# Patient Record
Sex: Female | Born: 1980 | Race: Black or African American | Hispanic: No | Marital: Single | Smoking: Never smoker
Health system: Southern US, Community
[De-identification: ages and names within clinical notes are randomized; demographics above are authoritative.]

## PROBLEM LIST (undated history)

## (undated) DIAGNOSIS — D649 Anemia, unspecified: Secondary | ICD-10-CM

## (undated) DIAGNOSIS — Z8669 Personal history of other diseases of the nervous system and sense organs: Secondary | ICD-10-CM

## (undated) DIAGNOSIS — K589 Irritable bowel syndrome without diarrhea: Secondary | ICD-10-CM

## (undated) HISTORY — PX: WISDOM TOOTH EXTRACTION: SHX21

## (undated) HISTORY — DX: Personal history of other diseases of the nervous system and sense organs: Z86.69

## (undated) HISTORY — PX: FOOT SURGERY: SHX648

## (undated) HISTORY — DX: Irritable bowel syndrome, unspecified: K58.9

## (undated) HISTORY — DX: Anemia, unspecified: D64.9

---

## 1999-06-16 ENCOUNTER — Emergency Department (HOSPITAL_COMMUNITY): Admission: EM | Admit: 1999-06-16 | Discharge: 1999-06-16 | Payer: Self-pay | Admitting: Emergency Medicine

## 1999-11-20 ENCOUNTER — Other Ambulatory Visit: Admission: RE | Admit: 1999-11-20 | Discharge: 1999-11-20 | Payer: Self-pay | Admitting: Internal Medicine

## 1999-12-13 ENCOUNTER — Inpatient Hospital Stay (HOSPITAL_COMMUNITY): Admission: AD | Admit: 1999-12-13 | Discharge: 1999-12-13 | Payer: Self-pay | Admitting: Internal Medicine

## 1999-12-17 ENCOUNTER — Inpatient Hospital Stay (HOSPITAL_COMMUNITY): Admission: AD | Admit: 1999-12-17 | Discharge: 1999-12-17 | Payer: Self-pay | Admitting: Obstetrics

## 2000-05-03 ENCOUNTER — Inpatient Hospital Stay (HOSPITAL_COMMUNITY): Admission: AD | Admit: 2000-05-03 | Discharge: 2000-05-03 | Payer: Self-pay | Admitting: Internal Medicine

## 2000-06-20 ENCOUNTER — Emergency Department (HOSPITAL_COMMUNITY): Admission: EM | Admit: 2000-06-20 | Discharge: 2000-06-20 | Payer: Self-pay | Admitting: Emergency Medicine

## 2000-11-05 ENCOUNTER — Other Ambulatory Visit: Admission: RE | Admit: 2000-11-05 | Discharge: 2000-11-05 | Payer: Self-pay | Admitting: Internal Medicine

## 2001-11-20 ENCOUNTER — Other Ambulatory Visit: Admission: RE | Admit: 2001-11-20 | Discharge: 2001-11-20 | Payer: Self-pay | Admitting: Internal Medicine

## 2003-07-26 ENCOUNTER — Other Ambulatory Visit: Admission: RE | Admit: 2003-07-26 | Discharge: 2003-07-26 | Payer: Self-pay | Admitting: Obstetrics and Gynecology

## 2004-04-07 ENCOUNTER — Ambulatory Visit (HOSPITAL_COMMUNITY): Admission: RE | Admit: 2004-04-07 | Discharge: 2004-04-07 | Payer: Self-pay | Admitting: Family Medicine

## 2004-04-07 ENCOUNTER — Emergency Department (HOSPITAL_COMMUNITY): Admission: EM | Admit: 2004-04-07 | Discharge: 2004-04-07 | Payer: Self-pay | Admitting: Emergency Medicine

## 2004-04-08 ENCOUNTER — Ambulatory Visit (HOSPITAL_COMMUNITY): Admission: RE | Admit: 2004-04-08 | Discharge: 2004-04-08 | Payer: Self-pay | Admitting: Family Medicine

## 2004-05-01 ENCOUNTER — Encounter: Admission: RE | Admit: 2004-05-01 | Discharge: 2004-05-01 | Payer: Self-pay | Admitting: Gastroenterology

## 2004-08-18 ENCOUNTER — Other Ambulatory Visit: Admission: RE | Admit: 2004-08-18 | Discharge: 2004-08-18 | Payer: Self-pay | Admitting: Obstetrics and Gynecology

## 2004-09-08 ENCOUNTER — Encounter: Admission: RE | Admit: 2004-09-08 | Discharge: 2004-09-08 | Payer: Self-pay | Admitting: Gastroenterology

## 2005-12-21 ENCOUNTER — Ambulatory Visit: Payer: Self-pay | Admitting: Gynecology

## 2006-04-24 ENCOUNTER — Ambulatory Visit: Payer: Self-pay | Admitting: Obstetrics & Gynecology

## 2009-04-21 ENCOUNTER — Emergency Department (HOSPITAL_COMMUNITY): Admission: EM | Admit: 2009-04-21 | Discharge: 2009-04-21 | Payer: Self-pay | Admitting: Emergency Medicine

## 2010-11-17 NOTE — Group Therapy Note (Signed)
NAME:  Kelly Walsh, Kelly Walsh NO.:  [id number]   MEDICAL RECORD NO.:  [id number]          PATIENT TYPE:  WOC   LOCATION:  [facility]                   FACILITY:  [facility]   PHYSICIAN:  Dorthula Perfect, MD     DATE OF BIRTH:  Dec 12, 1980   DATE OF SERVICE:                                    CLINIC NOTE   A 30 year old black female, nulligravida, presents today with an ongoing  problem with vaginal discharge.  Back in June of this year, she was treated  with Metrogel for a possible bacterial vaginitis.  On and off for the past  few months she has had a vaginal discharge.   She has not had sexual intercourse since January of this year.  She does not  douche.  She does not use scented toilet paper, and does pull the toilet  tissue in the correct front-to-back manner.  She uses Dove and other  moisturizing soaps when she bathes.   For birth control she has the Implanon.  Her last regular period was  September 5 and she has had occasional spotting.   PHYSICAL EXAM:  ABDOMEN:  Soft and nontender.  PELVIC:  External genitalia and BUS glands are normal.  Vaginal vault is  epithelialized.  There is a watery white discharge.  It is not odorous.  Cervix is normal.  Uterus is of normal size and shape.  Adnexal structures  are normal.   Wet prep is done by myself and reveals numerous bacteria and what appear to  be a few clue cells.  She had no yeast.   IMPRESSION:  Probable bacterial vaginosis.   DISPOSITION:  1. She is given a prescription for metronidazole 500 mg to take 1 two      times a day for a week.  2. She is instructed to keep all chemicals, moisturizers, lotions,      etcetera, away from her perineal area.  I have recommended Ivory soap      in the short term.           ______________________________  Dorthula Perfect, MD     ER/MEDQ  D:  04/24/2006  T:  04/25/2006  Job:  [id number]

## 2012-06-03 ENCOUNTER — Encounter: Payer: Self-pay | Admitting: Obstetrics and Gynecology

## 2012-06-03 ENCOUNTER — Ambulatory Visit (INDEPENDENT_AMBULATORY_CARE_PROVIDER_SITE_OTHER): Payer: BC Managed Care – PPO | Admitting: Obstetrics and Gynecology

## 2012-06-03 VITALS — BP 112/66 | Wt 170.0 lb

## 2012-06-03 DIAGNOSIS — Z30017 Encounter for initial prescription of implantable subdermal contraceptive: Secondary | ICD-10-CM

## 2012-06-03 DIAGNOSIS — Z3046 Encounter for surveillance of implantable subdermal contraceptive: Secondary | ICD-10-CM

## 2012-06-03 LAB — POCT URINE PREGNANCY: Preg Test, Ur: NEGATIVE

## 2012-06-03 MED ORDER — ETONOGESTREL 68 MG ~~LOC~~ IMPL
68.0000 mg | DRUG_IMPLANT | Freq: Once | SUBCUTANEOUS | Status: AC
  Administered 2012-06-03: 68 mg via SUBCUTANEOUS

## 2012-06-03 NOTE — Patient Instructions (Addendum)
Call [facility] [phone]:  -for temperature of 100.4 degrees Fahrenheit or more -pain not improved with over the counter pain medications (Ibuprofen, Advil, Aleve,     Tylenol or acetaminophen) -for excessive bleeding from insertion site -for excessive swelling redness or green drainage from your insertion site -for any other concerns -keep insertion site clean, dry and covered  for 48 hours -you may remove pressure bandage in 1-4 hours  Use a back-up method of birth control for the next 4 weeks

## 2012-06-03 NOTE — Progress Notes (Signed)
Discuss bc, past due for implanon removal. She desires to have implanon removal and insertion. We will check her insurance and  If cleared will be done by Henreitta Leber, PA-C  31 YO for Implanon removal and Nexplanon  reinsertion.  O: Implanon removed per protocol from medial left upper arm and Nexplanon placed per protocol in the same location, patient tolerated both procedures well and rod palpated by clinician,  incision closed with steri-strips and Benzoin, dressed with sterile band-aids, 4 x 4 gauze and Kling pressure dressing   UPT-negative  A: Implanon Removal      Nexplanon Insertion  P:Reviewed signs and symptoms of infection and wound care      RTO- 1 week or prn  POWELL,ELMIRA, PA-C

## 2012-06-11 ENCOUNTER — Encounter: Payer: BC Managed Care – PPO | Admitting: Obstetrics and Gynecology

## 2012-09-15 ENCOUNTER — Emergency Department (HOSPITAL_BASED_OUTPATIENT_CLINIC_OR_DEPARTMENT_OTHER): Payer: BC Managed Care – PPO

## 2012-09-15 ENCOUNTER — Emergency Department (HOSPITAL_BASED_OUTPATIENT_CLINIC_OR_DEPARTMENT_OTHER)
Admission: EM | Admit: 2012-09-15 | Discharge: 2012-09-15 | Disposition: A | Discharge status: Skilled Nursing Facility | Payer: BC Managed Care – PPO | Attending: Emergency Medicine | Admitting: Emergency Medicine

## 2012-09-15 ENCOUNTER — Encounter (HOSPITAL_BASED_OUTPATIENT_CLINIC_OR_DEPARTMENT_OTHER): Payer: Self-pay | Admitting: Family Medicine

## 2012-09-15 DIAGNOSIS — IMO0002 Reserved for concepts with insufficient information to code with codable children: Secondary | ICD-10-CM | POA: Insufficient documentation

## 2012-09-15 DIAGNOSIS — Z862 Personal history of diseases of the blood and blood-forming organs and certain disorders involving the immune mechanism: Secondary | ICD-10-CM | POA: Insufficient documentation

## 2012-09-15 DIAGNOSIS — Y9389 Activity, other specified: Secondary | ICD-10-CM | POA: Insufficient documentation

## 2012-09-15 DIAGNOSIS — R5381 Other malaise: Secondary | ICD-10-CM | POA: Insufficient documentation

## 2012-09-15 DIAGNOSIS — Z8679 Personal history of other diseases of the circulatory system: Secondary | ICD-10-CM | POA: Insufficient documentation

## 2012-09-15 DIAGNOSIS — R42 Dizziness and giddiness: Secondary | ICD-10-CM | POA: Insufficient documentation

## 2012-09-15 DIAGNOSIS — Y92410 Unspecified street and highway as the place of occurrence of the external cause: Secondary | ICD-10-CM | POA: Insufficient documentation

## 2012-09-15 DIAGNOSIS — S0083XA Contusion of other part of head, initial encounter: Secondary | ICD-10-CM | POA: Insufficient documentation

## 2012-09-15 DIAGNOSIS — S0993XA Unspecified injury of face, initial encounter: Secondary | ICD-10-CM | POA: Insufficient documentation

## 2012-09-15 DIAGNOSIS — S199XXA Unspecified injury of neck, initial encounter: Secondary | ICD-10-CM | POA: Insufficient documentation

## 2012-09-15 DIAGNOSIS — Z8719 Personal history of other diseases of the digestive system: Secondary | ICD-10-CM | POA: Insufficient documentation

## 2012-09-15 DIAGNOSIS — S0003XA Contusion of scalp, initial encounter: Secondary | ICD-10-CM | POA: Insufficient documentation

## 2012-09-15 DIAGNOSIS — Z79899 Other long term (current) drug therapy: Secondary | ICD-10-CM | POA: Insufficient documentation

## 2012-09-15 MED ORDER — TRAMADOL HCL 50 MG PO TABS
50.0000 mg | ORAL_TABLET | Freq: Once | ORAL | Status: AC
  Administered 2012-09-15: 50 mg via ORAL
  Filled 2012-09-15: qty 1

## 2012-09-15 MED ORDER — TRAMADOL HCL 50 MG PO TABS: 50.0000 mg | ORAL_TABLET | Freq: Four times a day (QID) | ORAL | Status: DC | PRN

## 2012-09-15 MED ORDER — ONDANSETRON 4 MG PO TBDP
4.0000 mg | ORAL_TABLET | Freq: Once | ORAL | Status: AC
  Administered 2012-09-15: 4 mg via ORAL
  Filled 2012-09-15: qty 1

## 2012-09-15 MED ORDER — IBUPROFEN 800 MG PO TABS
800.0000 mg | ORAL_TABLET | Freq: Once | ORAL | Status: AC
  Administered 2012-09-15: 800 mg via ORAL
  Filled 2012-09-15: qty 1

## 2012-09-15 MED ORDER — ONDANSETRON HCL 4 MG PO TABS: 4.0000 mg | ORAL_TABLET | Freq: Four times a day (QID) | ORAL | Status: DC

## 2012-09-15 MED ORDER — HYDROCODONE-ACETAMINOPHEN 5-325 MG PO TABS
1.0000 | ORAL_TABLET | Freq: Once | ORAL | Status: DC
  Filled 2012-09-15: qty 1
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
## 2014-03-12 ENCOUNTER — Encounter: Payer: Self-pay | Admitting: Podiatrist

## 2014-03-12 ENCOUNTER — Encounter: Payer: Self-pay | Admitting: *Deleted

## 2014-03-12 ENCOUNTER — Ambulatory Visit (INDEPENDENT_AMBULATORY_CARE_PROVIDER_SITE_OTHER): Payer: BC Managed Care – PPO | Admitting: Podiatrist

## 2014-03-12 DIAGNOSIS — L74519 Primary focal hyperhidrosis, unspecified: Secondary | ICD-10-CM

## 2014-03-12 DIAGNOSIS — R61 Generalized hyperhidrosis: Secondary | ICD-10-CM

## 2014-03-12 NOTE — Progress Notes (Signed)
   Subjective:    Patient ID: Kelly Walsh, female    DOB: 1980-08-19, 33 y.o.   MRN: [id number]  HPI PT STATED B/L UNDER FEET ARE DRY, SWEATING, BURNING, AND TURN WHITE FOR COUPLE OF YEARS. THE FEET ARE GETTING WORSE AND GET AGGRAVATED BY CLOSED SHOES AND SOAKING IN THE WATER. TRIED LOTION, SPECIAL SOCKS, ANTIFUNGAL BUT NO HELP   Review of Systems  All other systems reviewed and are negative.      Objective:   Physical Exam  Neurovascular status intact and unchanged. The patient has whitish discoloration of bilateral heels especially after showering. The patient states that the skin will turn White is a sheet after they get wet. She states her primary care physician/dermatologist has treated her for hyperhidrosis and she tried the medication on her feet but states it did not improve.      Assessment & Plan:  Hyperhidrosis  Plan: Started her on formadon every other day for the hyperhidrosis. She will use this for 2 weeks and call if there is no improvement. We'll consider Drysol or Botox injections as an addition.

## 2014-03-12 NOTE — Patient Instructions (Signed)
Try the formadon every other day for 2 weeks.  If it isn't better, give me a call and I can investigate further into the botox injections.

## 2015-01-31 ENCOUNTER — Ambulatory Visit (INDEPENDENT_AMBULATORY_CARE_PROVIDER_SITE_OTHER): Payer: BLUE CROSS/BLUE SHIELD

## 2015-01-31 ENCOUNTER — Encounter: Payer: Self-pay | Admitting: Podiatry

## 2015-01-31 ENCOUNTER — Ambulatory Visit (INDEPENDENT_AMBULATORY_CARE_PROVIDER_SITE_OTHER): Payer: BLUE CROSS/BLUE SHIELD | Admitting: Podiatry

## 2015-01-31 VITALS — BP 130/78 | HR 110 | Resp 15

## 2015-01-31 DIAGNOSIS — M2040 Other hammer toe(s) (acquired), unspecified foot: Secondary | ICD-10-CM | POA: Diagnosis not present

## 2015-01-31 DIAGNOSIS — M79673 Pain in unspecified foot: Secondary | ICD-10-CM

## 2015-01-31 DIAGNOSIS — L74519 Primary focal hyperhidrosis, unspecified: Secondary | ICD-10-CM | POA: Diagnosis not present

## 2015-01-31 DIAGNOSIS — R61 Generalized hyperhidrosis: Secondary | ICD-10-CM

## 2015-01-31 NOTE — Progress Notes (Signed)
   Subjective:    Patient ID: Kelly Walsh, female    DOB: January 11, 1981, 34 y.o.   MRN: [id number]  HPI Patient presents with bilateral discoloration of skin on heel of feet and big toes. Pt stated, "when sweating, in shower, swim, skin turns white; any water contact". This has been going on for the past several years. Pt went to see a dermatologist on 12/29/14 at [facility]. The doctor did not help the patient. The patient also presents with foot pain in the right foot. Pain is from the base of the great toe on the top and bottom of foot going into the arch on medial side. This has been going on since 01/20/15. Pt is taking Aleve with some relief.   Review of Systems  Musculoskeletal: Positive for back pain.  All other systems reviewed and are negative.      Objective:   Physical Exam        Assessment & Plan:

## 2015-02-01 NOTE — Progress Notes (Signed)
Subjective:     Patient ID: Kelly Walsh, female   DOB: 08/31/1980, 34 y.o.   MRN: [id number]  HPI patient presents stating she has discoloration in the heels of both feet and the big toe joint of both feet and also has some discomfort when walking and is concerned about the color is a at the pain that she occasionally experiences in her feet   Review of Systems  All other systems reviewed and are negative.      Objective:   Physical Exam  Constitutional: She is oriented to person, place, and time.  Cardiovascular: Intact distal pulses.   Musculoskeletal: Normal range of motion.  Neurological: She is oriented to person, place, and time.  Skin: Skin is warm.  Nursing note and vitals reviewed.  neurovascular status intact muscle strength adequate with discoloration in the heels bilateral and around the first metatarsal bilateral with moderate depression of the arch noted. 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 At this time we discussed the sweating and utilizing dry sol on her feet and that we cannot control it completely and she will probably have discoloration but only in these areas. I then went ahead and I discussed her x-rays and consideration long-term for orthotics and she will try over-the-counter devices to start

## 2015-06-06 ENCOUNTER — Ambulatory Visit (INDEPENDENT_AMBULATORY_CARE_PROVIDER_SITE_OTHER): Payer: BLUE CROSS/BLUE SHIELD | Admitting: Podiatry

## 2015-06-06 ENCOUNTER — Encounter: Payer: Self-pay | Admitting: Podiatry

## 2015-06-06 ENCOUNTER — Ambulatory Visit (INDEPENDENT_AMBULATORY_CARE_PROVIDER_SITE_OTHER): Payer: BLUE CROSS/BLUE SHIELD

## 2015-06-06 DIAGNOSIS — M2040 Other hammer toe(s) (acquired), unspecified foot: Secondary | ICD-10-CM | POA: Diagnosis not present

## 2015-06-06 DIAGNOSIS — M79673 Pain in unspecified foot: Secondary | ICD-10-CM

## 2015-06-06 DIAGNOSIS — M722 Plantar fascial fibromatosis: Secondary | ICD-10-CM | POA: Diagnosis not present

## 2015-06-06 MED ORDER — TRIAMCINOLONE ACETONIDE 10 MG/ML IJ SUSP
10.0000 mg | Freq: Once | INTRAMUSCULAR | Status: AC
  Administered 2015-06-06: 10 mg

## 2015-06-06 MED ORDER — DICLOFENAC SODIUM 75 MG PO TBEC: 75.0000 mg | DELAYED_RELEASE_TABLET | Freq: Two times a day (BID) | ORAL | Status: DC

## 2015-06-07 NOTE — Progress Notes (Signed)
Subjective:     Patient ID: Kelly Walsh, female   DOB: 09/23/1980, 34 y.o.   MRN: [id number]  HPI patient states I developed a lot of pain in my right heel and my toe has been doing a little bit better   Review of Systems     Objective:   Physical Exam Neurovascular status intact muscle strength adequate with discomfort of a significant nature right plantar fascia at the insertional point tendon the calcaneus with fluid buildup noted. Patient is also noted to have structural digital deformity fifth digit    Assessment:     Plantar fasciitis right with hammertoe deformity noted    Plan:     H&P conditions reviewed with patient and went ahead today and injected the plantar fascia right 3 mg Kenalog 5 mill grams Xylocaine and applied fascial brace after reviewing x-rays. Reappoint to recheck

## 2015-06-13 ENCOUNTER — Ambulatory Visit: Payer: BLUE CROSS/BLUE SHIELD | Admitting: Podiatry

## 2016-09-07 DIAGNOSIS — E161 Other hypoglycemia: Secondary | ICD-10-CM | POA: Diagnosis not present

## 2016-11-30 DIAGNOSIS — M5442 Lumbago with sciatica, left side: Secondary | ICD-10-CM | POA: Diagnosis not present

## 2016-11-30 DIAGNOSIS — M4686 Other specified inflammatory spondylopathies, lumbar region: Secondary | ICD-10-CM | POA: Diagnosis not present

## 2016-11-30 DIAGNOSIS — M5136 Other intervertebral disc degeneration, lumbar region: Secondary | ICD-10-CM | POA: Diagnosis not present

## 2016-11-30 DIAGNOSIS — M5441 Lumbago with sciatica, right side: Secondary | ICD-10-CM | POA: Diagnosis not present

## 2016-12-11 DIAGNOSIS — H52223 Regular astigmatism, bilateral: Secondary | ICD-10-CM | POA: Diagnosis not present

## 2016-12-11 DIAGNOSIS — H40022 Open angle with borderline findings, high risk, left eye: Secondary | ICD-10-CM | POA: Diagnosis not present

## 2016-12-11 DIAGNOSIS — H04123 Dry eye syndrome of bilateral lacrimal glands: Secondary | ICD-10-CM | POA: Diagnosis not present

## 2016-12-11 DIAGNOSIS — H40023 Open angle with borderline findings, high risk, bilateral: Secondary | ICD-10-CM | POA: Diagnosis not present

## 2016-12-11 DIAGNOSIS — H40021 Open angle with borderline findings, high risk, right eye: Secondary | ICD-10-CM | POA: Diagnosis not present

## 2016-12-11 DIAGNOSIS — H1013 Acute atopic conjunctivitis, bilateral: Secondary | ICD-10-CM | POA: Diagnosis not present

## 2016-12-15 DIAGNOSIS — M5441 Lumbago with sciatica, right side: Secondary | ICD-10-CM | POA: Diagnosis not present

## 2016-12-15 DIAGNOSIS — M5442 Lumbago with sciatica, left side: Secondary | ICD-10-CM | POA: Diagnosis not present

## 2017-01-03 DIAGNOSIS — G8929 Other chronic pain: Secondary | ICD-10-CM | POA: Diagnosis not present

## 2017-01-03 DIAGNOSIS — M5441 Lumbago with sciatica, right side: Secondary | ICD-10-CM | POA: Diagnosis not present

## 2017-01-03 DIAGNOSIS — M5126 Other intervertebral disc displacement, lumbar region: Secondary | ICD-10-CM | POA: Diagnosis not present

## 2017-02-09 DIAGNOSIS — F331 Major depressive disorder, recurrent, moderate: Secondary | ICD-10-CM | POA: Diagnosis not present

## 2017-02-14 DIAGNOSIS — N898 Other specified noninflammatory disorders of vagina: Secondary | ICD-10-CM | POA: Diagnosis not present

## 2017-02-14 DIAGNOSIS — L290 Pruritus ani: Secondary | ICD-10-CM | POA: Diagnosis not present

## 2017-02-14 DIAGNOSIS — L292 Pruritus vulvae: Secondary | ICD-10-CM | POA: Diagnosis not present

## 2017-02-14 DIAGNOSIS — R1030 Lower abdominal pain, unspecified: Secondary | ICD-10-CM | POA: Diagnosis not present

## 2017-02-21 DIAGNOSIS — M5136 Other intervertebral disc degeneration, lumbar region: Secondary | ICD-10-CM | POA: Diagnosis not present

## 2017-02-21 DIAGNOSIS — M5126 Other intervertebral disc displacement, lumbar region: Secondary | ICD-10-CM | POA: Diagnosis not present

## 2017-02-21 DIAGNOSIS — G8929 Other chronic pain: Secondary | ICD-10-CM | POA: Diagnosis not present

## 2017-02-21 DIAGNOSIS — M5441 Lumbago with sciatica, right side: Secondary | ICD-10-CM | POA: Diagnosis not present

## 2017-03-08 DIAGNOSIS — Z01419 Encounter for gynecological examination (general) (routine) without abnormal findings: Secondary | ICD-10-CM | POA: Diagnosis not present

## 2017-03-08 DIAGNOSIS — Z1389 Encounter for screening for other disorder: Secondary | ICD-10-CM | POA: Diagnosis not present

## 2017-03-08 DIAGNOSIS — Z124 Encounter for screening for malignant neoplasm of cervix: Secondary | ICD-10-CM | POA: Diagnosis not present

## 2017-03-08 DIAGNOSIS — L299 Pruritus, unspecified: Secondary | ICD-10-CM | POA: Diagnosis not present

## 2017-03-08 DIAGNOSIS — Z1151 Encounter for screening for human papillomavirus (HPV): Secondary | ICD-10-CM | POA: Diagnosis not present

## 2017-03-08 DIAGNOSIS — Z6827 Body mass index (BMI) 27.0-27.9, adult: Secondary | ICD-10-CM | POA: Diagnosis not present

## 2017-03-08 DIAGNOSIS — N761 Subacute and chronic vaginitis: Secondary | ICD-10-CM | POA: Diagnosis not present

## 2017-04-08 DIAGNOSIS — D251 Intramural leiomyoma of uterus: Secondary | ICD-10-CM | POA: Diagnosis not present

## 2017-04-08 DIAGNOSIS — D250 Submucous leiomyoma of uterus: Secondary | ICD-10-CM | POA: Diagnosis not present

## 2017-04-08 DIAGNOSIS — N920 Excessive and frequent menstruation with regular cycle: Secondary | ICD-10-CM | POA: Diagnosis not present

## 2017-04-16 DIAGNOSIS — M5441 Lumbago with sciatica, right side: Secondary | ICD-10-CM | POA: Diagnosis not present

## 2017-04-20 DIAGNOSIS — F320 Major depressive disorder, single episode, mild: Secondary | ICD-10-CM | POA: Diagnosis not present

## 2017-04-24 DIAGNOSIS — Z23 Encounter for immunization: Secondary | ICD-10-CM | POA: Diagnosis not present

## 2017-05-01 DIAGNOSIS — G5791 Unspecified mononeuropathy of right lower limb: Secondary | ICD-10-CM | POA: Diagnosis not present

## 2017-05-01 DIAGNOSIS — G8929 Other chronic pain: Secondary | ICD-10-CM | POA: Diagnosis not present

## 2017-05-01 DIAGNOSIS — M5136 Other intervertebral disc degeneration, lumbar region: Secondary | ICD-10-CM | POA: Diagnosis not present

## 2017-05-01 DIAGNOSIS — M5441 Lumbago with sciatica, right side: Secondary | ICD-10-CM | POA: Diagnosis not present

## 2017-05-30 DIAGNOSIS — M5441 Lumbago with sciatica, right side: Secondary | ICD-10-CM | POA: Diagnosis not present

## 2017-05-30 DIAGNOSIS — M5136 Other intervertebral disc degeneration, lumbar region: Secondary | ICD-10-CM | POA: Diagnosis not present

## 2017-07-05 DIAGNOSIS — R062 Wheezing: Secondary | ICD-10-CM | POA: Diagnosis not present

## 2017-07-05 DIAGNOSIS — R05 Cough: Secondary | ICD-10-CM | POA: Diagnosis not present

## 2017-07-05 DIAGNOSIS — Z6829 Body mass index (BMI) 29.0-29.9, adult: Secondary | ICD-10-CM | POA: Diagnosis not present

## 2017-10-07 DIAGNOSIS — M89061 Algoneurodystrophy, right lower leg: Secondary | ICD-10-CM | POA: Diagnosis not present

## 2017-10-07 DIAGNOSIS — G90529 Complex regional pain syndrome I of unspecified lower limb: Secondary | ICD-10-CM | POA: Insufficient documentation

## 2017-10-07 DIAGNOSIS — M5136 Other intervertebral disc degeneration, lumbar region: Secondary | ICD-10-CM | POA: Diagnosis not present

## 2017-10-07 DIAGNOSIS — G8929 Other chronic pain: Secondary | ICD-10-CM | POA: Diagnosis not present

## 2017-10-29 DIAGNOSIS — M89061 Algoneurodystrophy, right lower leg: Secondary | ICD-10-CM | POA: Diagnosis not present

## 2018-03-24 DIAGNOSIS — N898 Other specified noninflammatory disorders of vagina: Secondary | ICD-10-CM | POA: Diagnosis not present

## 2018-03-24 DIAGNOSIS — H04123 Dry eye syndrome of bilateral lacrimal glands: Secondary | ICD-10-CM | POA: Diagnosis not present

## 2018-03-24 DIAGNOSIS — H40023 Open angle with borderline findings, high risk, bilateral: Secondary | ICD-10-CM | POA: Diagnosis not present

## 2018-03-24 DIAGNOSIS — Z113 Encounter for screening for infections with a predominantly sexual mode of transmission: Secondary | ICD-10-CM | POA: Diagnosis not present

## 2018-03-24 DIAGNOSIS — Z124 Encounter for screening for malignant neoplasm of cervix: Secondary | ICD-10-CM | POA: Diagnosis not present

## 2018-03-24 DIAGNOSIS — Z01419 Encounter for gynecological examination (general) (routine) without abnormal findings: Secondary | ICD-10-CM | POA: Diagnosis not present

## 2018-03-24 DIAGNOSIS — Z6827 Body mass index (BMI) 27.0-27.9, adult: Secondary | ICD-10-CM | POA: Diagnosis not present

## 2018-03-24 DIAGNOSIS — H52223 Regular astigmatism, bilateral: Secondary | ICD-10-CM | POA: Diagnosis not present

## 2018-03-24 DIAGNOSIS — H1013 Acute atopic conjunctivitis, bilateral: Secondary | ICD-10-CM | POA: Diagnosis not present

## 2018-03-24 DIAGNOSIS — Z1389 Encounter for screening for other disorder: Secondary | ICD-10-CM | POA: Diagnosis not present

## 2018-03-25 DIAGNOSIS — Z1151 Encounter for screening for human papillomavirus (HPV): Secondary | ICD-10-CM | POA: Diagnosis not present

## 2018-03-25 DIAGNOSIS — Z124 Encounter for screening for malignant neoplasm of cervix: Secondary | ICD-10-CM | POA: Diagnosis not present

## 2018-03-25 LAB — HM PAP SMEAR: HM Pap smear: NEGATIVE

## 2018-04-03 DIAGNOSIS — R922 Inconclusive mammogram: Secondary | ICD-10-CM | POA: Diagnosis not present

## 2018-04-09 DIAGNOSIS — Z23 Encounter for immunization: Secondary | ICD-10-CM | POA: Diagnosis not present

## 2018-04-22 DIAGNOSIS — N9089 Other specified noninflammatory disorders of vulva and perineum: Secondary | ICD-10-CM | POA: Diagnosis not present

## 2018-08-05 DIAGNOSIS — Z0000 Encounter for general adult medical examination without abnormal findings: Secondary | ICD-10-CM | POA: Diagnosis not present

## 2018-08-11 DIAGNOSIS — N760 Acute vaginitis: Secondary | ICD-10-CM | POA: Diagnosis not present

## 2018-08-11 DIAGNOSIS — Z113 Encounter for screening for infections with a predominantly sexual mode of transmission: Secondary | ICD-10-CM | POA: Diagnosis not present

## 2018-08-11 DIAGNOSIS — N898 Other specified noninflammatory disorders of vagina: Secondary | ICD-10-CM | POA: Diagnosis not present

## 2018-08-25 ENCOUNTER — Ambulatory Visit: Payer: BLUE CROSS/BLUE SHIELD | Admitting: Family

## 2018-08-25 ENCOUNTER — Other Ambulatory Visit (INDEPENDENT_AMBULATORY_CARE_PROVIDER_SITE_OTHER): Payer: BLUE CROSS/BLUE SHIELD

## 2018-08-25 ENCOUNTER — Encounter (INDEPENDENT_AMBULATORY_CARE_PROVIDER_SITE_OTHER): Payer: Self-pay

## 2018-08-25 ENCOUNTER — Encounter: Payer: Self-pay | Admitting: Family

## 2018-08-25 VITALS — BP 120/76 | HR 69 | Temp 98.2°F | Ht 65.0 in | Wt 174.1 lb

## 2018-08-25 DIAGNOSIS — D219 Benign neoplasm of connective and other soft tissue, unspecified: Secondary | ICD-10-CM | POA: Insufficient documentation

## 2018-08-25 DIAGNOSIS — G43809 Other migraine, not intractable, without status migrainosus: Secondary | ICD-10-CM

## 2018-08-25 DIAGNOSIS — R5383 Other fatigue: Secondary | ICD-10-CM

## 2018-08-25 DIAGNOSIS — J309 Allergic rhinitis, unspecified: Secondary | ICD-10-CM | POA: Insufficient documentation

## 2018-08-25 DIAGNOSIS — F329 Major depressive disorder, single episode, unspecified: Secondary | ICD-10-CM | POA: Diagnosis not present

## 2018-08-25 DIAGNOSIS — J3089 Other allergic rhinitis: Secondary | ICD-10-CM | POA: Diagnosis not present

## 2018-08-25 DIAGNOSIS — R42 Dizziness and giddiness: Secondary | ICD-10-CM

## 2018-08-25 DIAGNOSIS — G43909 Migraine, unspecified, not intractable, without status migrainosus: Secondary | ICD-10-CM | POA: Insufficient documentation

## 2018-08-25 DIAGNOSIS — R51 Headache: Secondary | ICD-10-CM

## 2018-08-25 DIAGNOSIS — F32A Depression, unspecified: Secondary | ICD-10-CM

## 2018-08-25 DIAGNOSIS — G8929 Other chronic pain: Secondary | ICD-10-CM

## 2018-08-25 LAB — CBC WITH DIFFERENTIAL/PLATELET
BASOS ABS: 0 10*3/uL (ref 0.0–0.1)
Basophils Relative: 0.6 % (ref 0.0–3.0)
Eosinophils Absolute: 0 10*3/uL (ref 0.0–0.7)
Eosinophils Relative: 0.7 % (ref 0.0–5.0)
HCT: 39.8 % (ref 36.0–46.0)
Hemoglobin: 13.6 g/dL (ref 12.0–15.0)
Lymphocytes Relative: 53.7 % — ABNORMAL HIGH (ref 12.0–46.0)
Lymphs Abs: 2 10*3/uL (ref 0.7–4.0)
MCHC: 34.1 g/dL (ref 30.0–36.0)
MCV: 92.6 fl (ref 78.0–100.0)
Monocytes Absolute: 0.6 10*3/uL (ref 0.1–1.0)
Monocytes Relative: 15.4 % — ABNORMAL HIGH (ref 3.0–12.0)
Neutro Abs: 1.1 10*3/uL — ABNORMAL LOW (ref 1.4–7.7)
Neutrophils Relative %: 29.6 % — ABNORMAL LOW (ref 43.0–77.0)
PLATELETS: 195 10*3/uL (ref 150.0–400.0)
RBC: 4.3 Mil/uL (ref 3.87–5.11)
RDW: 12.4 % (ref 11.5–15.5)
WBC: 3.7 10*3/uL — ABNORMAL LOW (ref 4.0–10.5)

## 2018-08-25 LAB — COMPREHENSIVE METABOLIC PANEL
ALT: 28 U/L (ref 0–35)
AST: 24 U/L (ref 0–37)
Albumin: 4.2 g/dL (ref 3.5–5.2)
Alkaline Phosphatase: 52 U/L (ref 39–117)
BUN: 10 mg/dL (ref 6–23)
CALCIUM: 9.8 mg/dL (ref 8.4–10.5)
CO2: 28 mEq/L (ref 19–32)
Chloride: 104 mEq/L (ref 96–112)
Creatinine, Ser: 0.87 mg/dL (ref 0.40–1.20)
GFR: 87.5 mL/min (ref >60.00)
Glucose, Bld: 86 mg/dL (ref 70–99)
Potassium: 4.1 mEq/L (ref 3.5–5.1)
Sodium: 139 mEq/L (ref 135–145)
Total Bilirubin: 0.7 mg/dL (ref 0.2–1.2)
Total Protein: 7.1 g/dL (ref 6.0–8.3)

## 2018-08-25 LAB — VITAMIN D 25 HYDROXY (VIT D DEFICIENCY, FRACTURES): VITD: 54.53 ng/mL (ref 30.00–100.00)

## 2018-08-25 LAB — VITAMIN B12: Vitamin B-12: >1525 pg/mL — ABNORMAL HIGH (ref 211–911)

## 2018-08-25 LAB — TSH: TSH: 1.25 u[IU]/mL (ref 0.35–4.50)

## 2018-08-25 MED ORDER — RIZATRIPTAN BENZOATE 10 MG PO TABS: 10.0000 mg | ORAL_TABLET | ORAL | 0 refills | Status: DC | PRN

## 2018-08-25 NOTE — Patient Instructions (Signed)
Please keep a headache journal as discussed;

## 2018-08-25 NOTE — Progress Notes (Signed)
Kelly Walsh is a 38 y.o. female with the following history as recorded in Epic[employer]:  Patient Active Problem List   Diagnosis Date Noted  . Migraine headache 08/25/2018  . Fibroids 08/25/2018  . Allergic rhinitis 08/25/2018    Current Outpatient Medications  Medication Sig Dispense Refill  . fluticasone (FLONASE) 50 MCG/ACT nasal spray Place 2 sprays into the nose daily.    . Ibuprofen (MOTRIN PO) Take by mouth as needed.    . loratadine (CLARITIN) 10 MG tablet Take 10 mg by mouth daily.    . Probiotic Product (PROBIOTIC DAILY PO) Take by mouth.    . rizatriptan (MAXALT) 10 MG tablet Take 1 tablet (10 mg total) by mouth as needed for migraine. May repeat in 2 hours if needed 10 tablet 0   No current facility-administered medications for this visit.     Allergies: Bupropion; Metformin; Oxycodone-acetaminophen; Tramadol; Codeine; and Oxycodone  Past Medical History:  Diagnosis Date  . Anemia    H/O  . Hx of migraines   . IBS (irritable bowel syndrome)     Past Surgical History:  Procedure Laterality Date  . FOOT SURGERY    . WISDOM TOOTH EXTRACTION      Family History  Problem Relation Age of Onset  . Diabetes Father   . Hypertension Mother   . Lupus Cousin     Social History   Tobacco Use  . Smoking status: Never Smoker  . Smokeless tobacco: Never Used  Substance Use Topics  . Alcohol use: No    Subjective:  Patient presents today as a new patient; notes that she just has "not felt well" for the past few months;  having increased problems with nausea, dizziness, light- headed; no numbness in extremities;  Notes that she has had migraines since she was in her 32s; does feel like her headaches have been more problematic recently- lasting longer- can last for up to 3 days; + light- sensitive, sound- sensitive and nauseated; able to get some relief with ice pack; typically her migraines could be managed with Aleve and would resolve in a few hours; the frequency and  intensity of her headaches has been concerning; Also admits she has been feeling more depressed and would like a referral to a counselor- not interested in starting medication at this time; wonders if her mental health could be affecting her overall health as well.  Up to date on GYN needs/ has fibroids but not concerned for anemia at this time; notes that fibroids are not problematic at this time   LMP- 08/13/2018     Objective:  Vitals:   08/25/18 1113  BP: 120/76  Pulse: 69  Temp: 98.2 F (36.8 C)  TempSrc: Oral  SpO2: 95%  Weight: 174 lb 1.9 oz (79 kg)  Height: 5' 5"  (1.651 m)    General: Well developed, well nourished, in no acute distress  Skin : Warm and dry.  Head: Normocephalic and atraumatic  Eyes: Sclera and conjunctiva clear; pupils round and reactive to light; extraocular movements intact  Ears: External normal; canals clear; tympanic membranes normal  Oropharynx: Pink, supple. No suspicious lesions  Neck: Supple without thyromegaly, adenopathy  Lungs: Respirations unlabored; clear to auscultation bilaterally without wheeze, rales, rhonchi  CVS exam: normal rate and regular rhythm.  Musculoskeletal: No deformities; no active joint inflammation  Extremities: No edema, cyanosis, clubbing  Vessels: Symmetric bilaterally  Neurologic: Alert and oriented; speech intact; face symmetrical; moves all extremities well; CNII-XII intact without focal deficit  Assessment:  1. Depression, unspecified depression type   2. Other migraine without status migrainosus, not intractable   3. Allergic rhinitis due to other allergic trigger, unspecified seasonality   4. Dizziness   5. Other fatigue   6. Fibroids   7. Chronic nonintractable headache, unspecified headache type     Plan:  1. Refer to counselor as discussed; medication deferred at this time; 2. Will update labs and MRI; am suspicious that patient is actually having more migraines than she realizes and this is causing  majority of her symptoms; have asked her to keep headache journal- may need to consider daily preventive medication; Rx for Maxalt to use as needed- risks and benefits of medication discussed; follow-up in 1 month, sooner prn. 3. Allergies are stable on OTC medications; 4. ? Etiology; updated labs, MRI; follow-up to be determined; 6. Continue with GYN;  She understands to check on her out of pocket costs with MRI; let our office know if financially difficult to do this test.   Return in about 1 month (around 09/23/2018).  Orders Placed This Encounter  Procedures  . MR Brain W Wo Contrast    Standing Status:   Future    Standing Expiration Date:   10/24/2019    Order Specific Question:   If indicated for the ordered procedure, I authorize the administration of contrast media per Radiology protocol    Answer:   Yes    Order Specific Question:   What is the patient's sedation requirement?    Answer:   No Sedation    Order Specific Question:   Does the patient have a pacemaker or implanted devices?    Answer:   Yes    Order Specific Question:   Radiology Contrast Protocol - do NOT remove file path    Answer:   [email or web address]    Order Specific Question:   Preferred imaging location?    Answer:   [facility] (table limit-550lbs)  . CBC w/Diff    Standing Status:   Future    Number of Occurrences:   1    Standing Expiration Date:   08/25/2019  . Comp Met (CMET)    Standing Status:   Future    Number of Occurrences:   1    Standing Expiration Date:   08/25/2019  . TSH    Standing Status:   Future    Number of Occurrences:   1    Standing Expiration Date:   08/25/2019  . B12    Standing Status:   Future    Number of Occurrences:   1    Standing Expiration Date:   08/25/2019  . Vitamin D (25 hydroxy)    Standing Status:   Future    Number of Occurrences:   1    Standing Expiration Date:   08/25/2019  . Ambulatory referral to Psychology    Referral  Priority:   Routine    Referral Type:   Psychiatric    Referral Reason:   Specialty Services Required    Requested Specialty:   Psychology    Number of Visits Requested:   1    Requested Prescriptions   Signed Prescriptions Disp Refills  . rizatriptan (MAXALT) 10 MG tablet 10 tablet 0    Sig: Take 1 tablet (10 mg total) by mouth as needed for migraine. May repeat in 2 hours if needed

## 2018-08-27 DIAGNOSIS — N390 Urinary tract infection, site not specified: Secondary | ICD-10-CM | POA: Diagnosis not present

## 2018-08-27 DIAGNOSIS — R309 Painful micturition, unspecified: Secondary | ICD-10-CM | POA: Diagnosis not present

## 2018-08-27 DIAGNOSIS — B373 Candidiasis of vulva and vagina: Secondary | ICD-10-CM | POA: Diagnosis not present

## 2018-09-02 ENCOUNTER — Encounter: Payer: Self-pay | Admitting: Family

## 2018-09-02 NOTE — Progress Notes (Signed)
Outside notes received. Information abstracted. Notes sent to scan.  

## 2018-09-03 ENCOUNTER — Encounter: Payer: Self-pay | Admitting: Psychology

## 2018-09-05 ENCOUNTER — Encounter: Payer: Self-pay | Admitting: Family

## 2018-09-05 ENCOUNTER — Ambulatory Visit: Payer: BLUE CROSS/BLUE SHIELD | Admitting: Family

## 2018-09-05 VITALS — BP 124/76 | HR 96 | Temp 98.3°F | Ht 65.0 in | Wt 177.0 lb

## 2018-09-05 DIAGNOSIS — J029 Acute pharyngitis, unspecified: Secondary | ICD-10-CM

## 2018-09-05 LAB — POCT RAPID STREP A (OFFICE): Rapid Strep A Screen: POSITIVE — AB

## 2018-09-05 MED ORDER — AMOXICILLIN 875 MG PO TABS: 875.0000 mg | ORAL_TABLET | Freq: Two times a day (BID) | ORAL | 0 refills | Status: DC

## 2018-09-05 MED ORDER — FLUCONAZOLE 150 MG PO TABS: 150.0000 mg | ORAL_TABLET | Freq: Once | ORAL | 0 refills | Status: AC

## 2018-09-05 NOTE — Progress Notes (Signed)
  Kelly Walsh is a 38 y.o. female with the following history as recorded in [employer]:  Patient Active Problem List   Diagnosis Date Noted  . Migraine headache 08/25/2018  . Fibroids 08/25/2018  . Allergic rhinitis 08/25/2018    Current Outpatient Medications  Medication Sig Dispense Refill  . fluticasone (FLONASE) 50 MCG/ACT nasal spray Place 2 sprays into the nose daily.    . Ibuprofen (MOTRIN PO) Take by mouth as needed.    . loratadine (CLARITIN) 10 MG tablet Take 10 mg by mouth daily.    . Probiotic Product (PROBIOTIC DAILY PO) Take by mouth.    . rizatriptan (MAXALT) 10 MG tablet Take 1 tablet (10 mg total) by mouth as needed for migraine. May repeat in 2 hours if needed 10 tablet 0  . amoxicillin (AMOXIL) 875 MG tablet Take 1 tablet (875 mg total) by mouth 2 (two) times daily. 20 tablet 0   No current facility-administered medications for this visit.     Allergies: Bupropion; Metformin; Oxycodone-acetaminophen; Tramadol; Codeine; and Oxycodone  Past Medical History:  Diagnosis Date  . Anemia    H/O  . Hx of migraines   . IBS (irritable bowel syndrome)     Past Surgical History:  Procedure Laterality Date  . FOOT SURGERY    . WISDOM TOOTH EXTRACTION      Family History  Problem Relation Age of Onset  . Diabetes Father   . Hypertension Mother   . Lupus Cousin     Social History   Tobacco Use  . Smoking status: Never Smoker  . Smokeless tobacco: Never Used  Substance Use Topics  . Alcohol use: No    Subjective:  Sore throat x 2 days; history of tonsillitis; prone to strep throat in her early 46s; + sore to swallow; no fever;  Of note, has been treated for BV and UTI since she was last seen here;    Objective:  Vitals:   09/05/18 1057  BP: 124/76  Pulse: 96  Temp: 98.3 F (36.8 C)  TempSrc: Oral  Weight: 177 lb (80.3 kg)  Height: 5\' 5"  (1.651 m)    General: Well developed, well nourished, in no acute distress  Skin : Warm and dry.  Head:  Normocephalic and atraumatic  Eyes: Sclera and conjunctiva clear; pupils round and reactive to light; extraocular movements intact  Ears: External normal; canals clear; tympanic membranes normal  Oropharynx: Pink, supple. Erythematous tonsils; + exudates Neck: Supple without thyromegaly, adenopathy  Lungs: Respirations unlabored; clear to auscultation bilaterally without wheeze, rales, rhonchi  Neurologic: Alert and oriented; speech intact; face symmetrical; moves all extremities well; CNII-XII intact without focal deficit   Assessment:  1. Sore throat     Plan:  Rapid strep is positive; Rx for Amoxicillin 875 mg bid x 10 days; change toothbrush after 24 hours/ again at end of treatment.   No follow-ups on file.  Orders Placed This Encounter  Procedures  . POCT rapid strep A    Requested Prescriptions   Signed Prescriptions Disp Refills  . amoxicillin (AMOXIL) 875 MG tablet 20 tablet 0    Sig: Take 1 tablet (875 mg total) by mouth 2 (two) times daily.

## 2018-09-05 NOTE — Addendum Note (Signed)
Addended by: Sherlene Shams on: 09/05/2018 11:18 AM   Modules accepted: Orders

## 2018-09-06 ENCOUNTER — Ambulatory Visit
Admission: RE | Admit: 2018-09-06 | Discharge: 2018-09-06 | Disposition: A | Discharge status: Home / Self Care | Payer: BLUE CROSS/BLUE SHIELD | Source: Ambulatory Visit | Attending: Family | Admitting: Family

## 2018-09-06 DIAGNOSIS — G8929 Other chronic pain: Secondary | ICD-10-CM

## 2018-09-06 DIAGNOSIS — R51 Headache: Principal | ICD-10-CM

## 2018-09-06 DIAGNOSIS — G43709 Chronic migraine without aura, not intractable, without status migrainosus: Secondary | ICD-10-CM | POA: Diagnosis not present

## 2018-09-06 MED ORDER — GADOBENATE DIMEGLUMINE 529 MG/ML IV SOLN
15.0000 mL | Freq: Once | INTRAVENOUS | Status: AC | PRN
  Administered 2018-09-06: 15 mL via INTRAVENOUS

## 2018-09-08 ENCOUNTER — Other Ambulatory Visit: Payer: Self-pay | Admitting: Family

## 2018-09-08 MED ORDER — TOPIRAMATE 25 MG PO TABS: ORAL_TABLET | ORAL | 1 refills | Status: DC

## 2018-09-23 ENCOUNTER — Ambulatory Visit: Payer: BLUE CROSS/BLUE SHIELD | Admitting: Family

## 2018-10-02 DIAGNOSIS — L293 Anogenital pruritus, unspecified: Secondary | ICD-10-CM | POA: Diagnosis not present

## 2018-10-17 ENCOUNTER — Encounter: Payer: BLUE CROSS/BLUE SHIELD | Attending: Psychology | Admitting: Psychology

## 2018-10-17 ENCOUNTER — Other Ambulatory Visit: Payer: Self-pay

## 2018-10-17 DIAGNOSIS — G43501 Persistent migraine aura without cerebral infarction, not intractable, with status migrainosus: Secondary | ICD-10-CM | POA: Diagnosis not present

## 2018-10-17 DIAGNOSIS — F331 Major depressive disorder, recurrent, moderate: Secondary | ICD-10-CM | POA: Diagnosis not present

## 2018-11-14 ENCOUNTER — Other Ambulatory Visit (INDEPENDENT_AMBULATORY_CARE_PROVIDER_SITE_OTHER): Payer: BLUE CROSS/BLUE SHIELD

## 2018-11-14 ENCOUNTER — Telehealth: Payer: Self-pay

## 2018-11-14 DIAGNOSIS — R8290 Unspecified abnormal findings in urine: Secondary | ICD-10-CM | POA: Diagnosis not present

## 2018-11-14 LAB — URINALYSIS
Bilirubin Urine: NEGATIVE
Hgb urine dipstick: NEGATIVE
Ketones, ur: NEGATIVE
Leukocytes,Ua: NEGATIVE
Nitrite: NEGATIVE
Specific Gravity, Urine: 1.025 (ref 1.000–1.030)
Total Protein, Urine: NEGATIVE
Urine Glucose: NEGATIVE
Urobilinogen, UA: 0.2 (ref 0.0–1.0)
pH: 6 (ref 5.0–8.0)

## 2018-11-14 NOTE — Telephone Encounter (Signed)
Sent patient my-chart message and she responded back. Sent her lab info with hours and told her orders had already been put into place.

## 2018-11-14 NOTE — Telephone Encounter (Signed)
Copied from [place] [phone]. Topic: General - Inquiry >> Nov 14, 2018 11:45 AM Scherrie Gerlach wrote: Reason for CRM:  pt states her urine is cloudy and she thinks it may be because of taking the topiramate (TOPAMAX) 25 MG tablet .  From what she read, this could be a serious side effect and she wants to know if she should stop taking. Prescribed by Mickel Baas Pt is not having any UA symptoms, so she does not think it is a UTI

## 2018-11-14 NOTE — Telephone Encounter (Signed)
Please ask her to drop off a urine sample; we will want to make sure she does not have an infection. Is the Topamax helping? If yes it is helping and we don't find an infection, she needs to drink more water.

## 2018-11-16 LAB — URINE CULTURE
MICRO NUMBER:: 479555
SPECIMEN QUALITY:: ADEQUATE

## 2018-11-17 ENCOUNTER — Other Ambulatory Visit: Payer: Self-pay | Admitting: Family

## 2018-11-17 MED ORDER — FLUCONAZOLE 150 MG PO TABS: 150.0000 mg | ORAL_TABLET | Freq: Once | ORAL | 0 refills | Status: AC

## 2018-11-17 MED ORDER — PENICILLIN V POTASSIUM 500 MG PO TABS: 500.0000 mg | ORAL_TABLET | Freq: Three times a day (TID) | ORAL | 0 refills | Status: AC

## 2018-11-27 ENCOUNTER — Encounter: Payer: Self-pay | Admitting: Psychology

## 2018-11-27 NOTE — Progress Notes (Signed)
Today the clinical interview was conducted and the full report from this interview will be provided on a later date to allow for full record review.  The patient was referred by Jodi Mourning, FNP who is her PCP.  The patient has been dealing with significant severe chronic migraines that have worsened more recently.  Her migraines were present in her early 37s but she was able to manage them with medications but now they have been very difficult to manage.  She has had an MRI without significant results.  Medications such as Maxalt are not very helpful.  The patient has had increasing depressive symptoms, fatigue, oversleeping and lack of interest.  We have set the patient up for therapeutic interventions to focus on coping with her migraines and assisting in decreasing the frequency, intensity and duration of her migraines as well as specifically working on her issues related to her depressive symptoms.

## 2018-12-10 ENCOUNTER — Other Ambulatory Visit: Payer: Self-pay | Admitting: Family

## 2018-12-10 DIAGNOSIS — R300 Dysuria: Secondary | ICD-10-CM

## 2018-12-11 ENCOUNTER — Other Ambulatory Visit (INDEPENDENT_AMBULATORY_CARE_PROVIDER_SITE_OTHER): Payer: BC Managed Care – PPO

## 2018-12-11 DIAGNOSIS — R300 Dysuria: Secondary | ICD-10-CM | POA: Diagnosis not present

## 2018-12-11 LAB — URINALYSIS
Bilirubin Urine: NEGATIVE
Hgb urine dipstick: NEGATIVE
Ketones, ur: 15 — AB
Leukocytes,Ua: NEGATIVE
Nitrite: NEGATIVE
Specific Gravity, Urine: 1.025 (ref 1.000–1.030)
Urine Glucose: NEGATIVE
Urobilinogen, UA: 0.2 (ref 0.0–1.0)
pH: 5.5 (ref 5.0–8.0)

## 2018-12-12 ENCOUNTER — Ambulatory Visit (INDEPENDENT_AMBULATORY_CARE_PROVIDER_SITE_OTHER): Payer: BC Managed Care – PPO | Admitting: Family

## 2018-12-12 ENCOUNTER — Encounter: Payer: Self-pay | Admitting: Family

## 2018-12-12 ENCOUNTER — Other Ambulatory Visit: Payer: Self-pay

## 2018-12-12 DIAGNOSIS — R140 Abdominal distension (gaseous): Secondary | ICD-10-CM

## 2018-12-12 DIAGNOSIS — R102 Pelvic and perineal pain: Secondary | ICD-10-CM | POA: Diagnosis not present

## 2018-12-12 DIAGNOSIS — G43801 Other migraine, not intractable, with status migrainosus: Secondary | ICD-10-CM | POA: Diagnosis not present

## 2018-12-12 LAB — URINE CULTURE
MICRO NUMBER:: 560205
Result:: NO GROWTH
SPECIMEN QUALITY:: ADEQUATE

## 2018-12-12 NOTE — Progress Notes (Signed)
Kelly Walsh is a 38 y.o. female with the following history as recorded in [facility]:  Patient Active Problem List   Diagnosis Date Noted  . Migraine headache 08/25/2018  . Fibroids 08/25/2018  . Allergic rhinitis 08/25/2018    Current Outpatient Medications  Medication Sig Dispense Refill  . fluticasone (FLONASE) 50 MCG/ACT nasal spray Place 2 sprays into the nose daily.    . Ibuprofen (MOTRIN PO) Take by mouth as needed.    . loratadine (CLARITIN) 10 MG tablet Take 10 mg by mouth daily.    . Probiotic Product (PROBIOTIC DAILY PO) Take by mouth.    . rizatriptan (MAXALT) 10 MG tablet Take 1 tablet (10 mg total) by mouth as needed for migraine. May repeat in 2 hours if needed 10 tablet 0   No current facility-administered medications for this visit.     Allergies: Bupropion, Metformin, Oxycodone-acetaminophen, Tramadol, Codeine, and Oxycodone  Past Medical History:  Diagnosis Date  . Anemia    H/O  . Hx of migraines   . IBS (irritable bowel syndrome)     Past Surgical History:  Procedure Laterality Date  . FOOT SURGERY    . WISDOM TOOTH EXTRACTION      Family History  Problem Relation Age of Onset  . Diabetes Father   . Hypertension Mother   . Lupus Cousin     Social History   Tobacco Use  . Smoking status: Never Smoker  . Smokeless tobacco: Never Used  Substance Use Topics  . Alcohol use: No    Subjective:    I connected with Kelly Walsh on 12/12/18 at  3:00 PM EDT by a video enabled telemedicine application and verified that I am speaking with the correct person using two identifiers. Patient and I are the only 2 people on the video call.    I discussed the limitations of evaluation and management by telemedicine and the availability of in person appointments. The patient expressed understanding and agreed to proceed.  Patient has been experiencing increased pelvic pressure/ sense of bloating in the past week. Known history of uterine fibroids; LMP-  mid-May/ notes that cramps were much more severe than she is used to; wanted to make sure she does not have an underlying bladder infection but denies any burning with urination, urgency or frequency; was concerned that the Topamax was causing her symptoms but has been off the medication for the past week and no change in symptoms; bowel movements have been normal; no nausea or vomiting;  Also notes that her migraines never seemed to respond to Topamax- could not tell any type of benefit; normal MRI; still having almost daily headaches and 1-2 per month that are "severe."   Objective:  There were no vitals filed for this visit.  General: Well developed, well nourished, in no acute distress  Skin : Warm and dry.  Head: Normocephalic and atraumatic  Lungs: Respirations unlabored;  Neurologic: Alert and oriented; speech intact; face symmetrical; moves all extremities well; CNII-XII intact without focal deficit   Assessment:  1. Other migraine with status migrainosus, not intractable   2. Pelvic pressure in female   3. Abdominal bloating     Plan:  1. D/C Topamax- no benefit for patient; refer to neurology; 2. & 3. U/A done yesterday was not remarkable; urine culture is still pending; discussed need to update pelvic ultrasound- she prefers to have this done by her GYN; follow-up to be determined- if no GYN source found, need to consider  interstitial cystitis.  Spent 30 minutes with patient; greater than 50% spent in counseling;    No follow-ups on file.  Orders Placed This Encounter  Procedures  . Ambulatory referral to Neurology    Referral Priority:   Routine    Referral Type:   Consultation    Referral Reason:   Specialty Services Required    Requested Specialty:   Neurology    Number of Visits Requested:   1    Requested Prescriptions    No prescriptions requested or ordered in this encounter

## 2018-12-23 DIAGNOSIS — Z113 Encounter for screening for infections with a predominantly sexual mode of transmission: Secondary | ICD-10-CM | POA: Diagnosis not present

## 2018-12-23 DIAGNOSIS — N898 Other specified noninflammatory disorders of vagina: Secondary | ICD-10-CM | POA: Diagnosis not present

## 2018-12-23 DIAGNOSIS — N945 Secondary dysmenorrhea: Secondary | ICD-10-CM | POA: Diagnosis not present

## 2018-12-23 DIAGNOSIS — N924 Excessive bleeding in the premenopausal period: Secondary | ICD-10-CM | POA: Diagnosis not present

## 2018-12-23 NOTE — Progress Notes (Signed)
Virtual Visit via Video Note The purpose of this virtual visit is to provide medical care while limiting exposure to the novel coronavirus.    Consent was obtained for video visit:  Yes.   Answered questions that patient had about telehealth interaction:  Yes.   I discussed the limitations, risks, security and privacy concerns of performing an evaluation and management service by telemedicine. I also discussed with the patient that there may be a patient responsible charge related to this service. The patient expressed understanding and agreed to proceed.  Pt location: Home Physician Location: Home Name of referring provider:  Marrian Salvage,* I connected with Kathrin Greathouse Chisenhall at patients initiation/request on 12/24/2018 at  1:50 PM EDT by video enabled telemedicine application and verified that I am speaking with the correct person using two identifiers. Pt MRN:  [id number] Pt DOB:  08/06/1980 Video Participants:  Kathrin Greathouse Top   History of Present Illness:  Kelly Walsh is a 38 year old woman who presents for migraines.  History supplemented by referring provider note.  Onset:  Since early 20s.  They were fairly well controlled and responded to Aleve until gradually getting worse in 2018 in which they have become more frequent and not responding to medication. Location:  bifrontal Quality:  sharp Intensity:  Severe.  She denies new headache, thunderclap headache Aura:  no Premonitory Phase:  no Postdrome:  no Associated symptoms:  Photophobia, phonophobia, osmophobia.  She denies associated nausea, vomiting, visual disturbance, or unilateral numbness or weakness. Duration:  Quick onset.  Usually 3 to 4 days Frequency:  At least once a month Triggers:  Unknown Relieving factors:  Air conditioning, ice pack, rest in quiet dark room Activity:  Cannot function for 3 days  MRI of brain with and without contrast from 01/27/14 was normal.  MRI of brain with and without contrast  from 09/07/18 personally reviewed and was normal.  Current NSAIDS:  naproxen Current Antihistamines/Decongestants:  Claritin, Flonase  Past NSAIDS:  ibuprofen Past analgesics: Excedrin Migraine, Tylenol Past abortive triptans:  Maxalt Past abortive ergotamine:  none Past muscle relaxants:  none Past anti-emetic:  Zofran 4mg  Past antihypertensive medications:  none Past antidepressant medications:  none Past anticonvulsant medications:  topiramate 25mg  twice daily (stopped due to UTI, cloudy urine and ineffective) Past anti-CGRP:  none Past vitamins/Herbal/Supplements:  none Past antihistamines/decongestants:  none Other past therapies:  none  Caffeine:  1/2 cup of coffee but not daily.  Rarely has a sip of 5 hour energy drink Diet:  Hydrates with water.   Exercise:  Not routine but just started to exercise. Depression:  some; Anxiety:  yes Other pain:  Back pain, right shoulder pain Sleep hygiene:  Varies.  History of chronic fatigue.  She was supposed to have a sleep study but was never able to keep the appointment.   Family history of headache:  No  Past Medical History: Past Medical History:  Diagnosis Date  . Anemia    H/O  . Hx of migraines   . IBS (irritable bowel syndrome)     Medications: Outpatient Encounter Medications as of 12/24/2018  Medication Sig  . fluticasone (FLONASE) 50 MCG/ACT nasal spray Place 2 sprays into the nose daily.  . Ibuprofen (MOTRIN PO) Take by mouth as needed.  . loratadine (CLARITIN) 10 MG tablet Take 10 mg by mouth daily.  . Probiotic Product (PROBIOTIC DAILY PO) Take by mouth.  . rizatriptan (MAXALT) 10 MG tablet Take 1 tablet (10 mg total) by  mouth as needed for migraine. May repeat in 2 hours if needed   No facility-administered encounter medications on file as of 12/24/2018.     Allergies: Allergies  Allergen Reactions  . Bupropion Hives  . Metformin Nausea Only  . Oxycodone-Acetaminophen Itching  . Tramadol Itching  . Codeine    . Oxycodone     Family History: Family History  Problem Relation Age of Onset  . Diabetes Father   . Hypertension Mother   . Lupus Cousin     Social History: Social History   Socioeconomic History  . Marital status: Single    Spouse name: Not on file  . Number of children: Not on file  . Years of education: Not on file  . Highest education level: Not on file  Occupational History  . Not on file  Social Needs  . Financial resource strain: Not on file  . Food insecurity    Worry: Not on file    Inability: Not on file  . Transportation needs    Medical: Not on file    Non-medical: Not on file  Tobacco Use  . Smoking status: Never Smoker  . Smokeless tobacco: Never Used  Substance and Sexual Activity  . Alcohol use: No  . Drug use: No  . Sexual activity: Yes    Birth control/protection: Implant, Condom  Lifestyle  . Physical activity    Days per week: Not on file    Minutes per session: Not on file  . Stress: Not on file  Relationships  . Social Herbalist on phone: Not on file    Gets together: Not on file    Attends religious service: Not on file    Active member of club or organization: Not on file    Attends meetings of clubs or organizations: Not on file    Relationship status: Not on file  . Intimate partner violence    Fear of current or ex partner: Not on file    Emotionally abused: Not on file    Physically abused: Not on file    Forced sexual activity: Not on file  Other Topics Concern  . Not on file  Social History Narrative  . Not on file   Observations/Objective:   Height 5\' 5"  (1.651 m), weight 168 lb (76.2 kg). No acute distress.  Alert and oriented.  Speech fluent and not dysarthric.  Language intact.  Face symmetric.    Assessment and Plan:   Migraine without aura, without status migrainosus, intractable  1.  For preventative management, start nortriptyline 10mg  at bedtime.  We can increase dose to 25mg  at bedtime in 4  weeks if needed.  She would like to avoid injections such as anti-CGRP 2.  For abortive therapy, sumatriptan 100mg  3.  Limit use of pain relievers to no more than 2 days out of week to prevent risk of rebound or medication-overuse headache. 4.  Keep headache diary 5.  Exercise, hydration, caffeine cessation, sleep hygiene, monitor for and avoid triggers 6.  Consider:  magnesium citrate 400mg  daily, riboflavin 400mg  daily, and coenzyme Q10 100mg  three times daily 7. Always keep in mind that currently taking a hormone or birth control may be a possible trigger or aggravating factor for migraine. 8. Follow up 4 months   Follow Up Instructions:    -I discussed the assessment and treatment plan with the patient. The patient was provided an opportunity to ask questions and all were answered. The patient agreed with  the plan and demonstrated an understanding of the instructions.   The patient was advised to call back or seek an in-person evaluation if the symptoms worsen or if the condition fails to improve as anticipated.   Dudley Major, DO

## 2018-12-24 ENCOUNTER — Other Ambulatory Visit: Payer: Self-pay

## 2018-12-24 ENCOUNTER — Encounter: Payer: Self-pay | Admitting: Neurology

## 2018-12-24 ENCOUNTER — Telehealth (INDEPENDENT_AMBULATORY_CARE_PROVIDER_SITE_OTHER): Payer: BC Managed Care – PPO | Admitting: Neurology

## 2018-12-24 VITALS — Ht 65.0 in | Wt 168.0 lb

## 2018-12-24 DIAGNOSIS — G43019 Migraine without aura, intractable, without status migrainosus: Secondary | ICD-10-CM | POA: Diagnosis not present

## 2018-12-24 MED ORDER — SUMATRIPTAN SUCCINATE 100 MG PO TABS: ORAL_TABLET | ORAL | 3 refills | Status: DC

## 2018-12-24 MED ORDER — NORTRIPTYLINE HCL 10 MG PO CAPS: 10.0000 mg | ORAL_CAPSULE | Freq: Every day | ORAL | 3 refills | Status: DC

## 2018-12-24 NOTE — Patient Instructions (Signed)
Migraine Recommendations: 1.  Start nortriptyline 10mg  at bedtime.  Call in 4 weeks with update and we can adjust dose if needed. 2.  Take sumatriptan 100mg  at earliest onset of headache.  May repeat dose once in 2 hours if needed.  Do not exceed two tablets in 24 hours. 3.  Limit use of pain relievers to no more than 2 days out of the week.  These medications include acetaminophen, ibuprofen, triptans and narcotics.  This will help reduce risk of rebound headaches. 4.  Be aware of common food triggers such as processed sweets, processed foods with nitrites (such as deli meat, hot dogs, sausages), foods with MSG, alcohol (such as wine), chocolate, certain cheeses, certain fruits (dried fruits, bananas, pineapple), vinegar, diet soda. 4.  Avoid caffeine 5.  Routine exercise 6.  Proper sleep hygiene 7.  Stay adequately hydrated with water 8.  Keep a headache diary. 9.  Maintain proper stress management. 10.  Do not skip meals. 11.  Consider supplements:  Magnesium citrate 400mg  to 600mg  daily, riboflavin 400mg , Coenzyme Q 10 100mg  three times daily

## 2018-12-25 DIAGNOSIS — H1045 Other chronic allergic conjunctivitis: Secondary | ICD-10-CM | POA: Diagnosis not present

## 2018-12-25 DIAGNOSIS — H04123 Dry eye syndrome of bilateral lacrimal glands: Secondary | ICD-10-CM | POA: Diagnosis not present

## 2018-12-25 DIAGNOSIS — H40023 Open angle with borderline findings, high risk, bilateral: Secondary | ICD-10-CM | POA: Diagnosis not present

## 2019-01-21 DIAGNOSIS — Z3201 Encounter for pregnancy test, result positive: Secondary | ICD-10-CM | POA: Diagnosis not present

## 2019-01-23 DIAGNOSIS — Z3201 Encounter for pregnancy test, result positive: Secondary | ICD-10-CM | POA: Diagnosis not present

## 2019-01-30 DIAGNOSIS — O021 Missed abortion: Secondary | ICD-10-CM | POA: Diagnosis not present

## 2019-02-03 ENCOUNTER — Encounter: Payer: BC Managed Care – PPO | Attending: Psychology | Admitting: Psychology

## 2019-02-03 DIAGNOSIS — G43501 Persistent migraine aura without cerebral infarction, not intractable, with status migrainosus: Secondary | ICD-10-CM | POA: Insufficient documentation

## 2019-02-03 DIAGNOSIS — F331 Major depressive disorder, recurrent, moderate: Secondary | ICD-10-CM | POA: Insufficient documentation

## 2019-02-06 DIAGNOSIS — O021 Missed abortion: Secondary | ICD-10-CM | POA: Diagnosis not present

## 2019-02-06 DIAGNOSIS — N945 Secondary dysmenorrhea: Secondary | ICD-10-CM | POA: Diagnosis not present

## 2019-02-13 DIAGNOSIS — O021 Missed abortion: Secondary | ICD-10-CM | POA: Diagnosis not present

## 2019-02-17 ENCOUNTER — Other Ambulatory Visit: Payer: Self-pay

## 2019-02-17 ENCOUNTER — Encounter (HOSPITAL_BASED_OUTPATIENT_CLINIC_OR_DEPARTMENT_OTHER): Payer: BC Managed Care – PPO | Admitting: Psychology

## 2019-02-17 DIAGNOSIS — G43501 Persistent migraine aura without cerebral infarction, not intractable, with status migrainosus: Secondary | ICD-10-CM | POA: Diagnosis not present

## 2019-02-17 DIAGNOSIS — F331 Major depressive disorder, recurrent, moderate: Secondary | ICD-10-CM

## 2019-02-19 DIAGNOSIS — O021 Missed abortion: Secondary | ICD-10-CM | POA: Diagnosis not present

## 2019-02-26 ENCOUNTER — Encounter: Payer: Self-pay | Admitting: Psychology

## 2019-02-26 NOTE — Progress Notes (Signed)
  02/26/2019: Today was a follow-up appointment.  The patient was seen for an in person visit that lasted for 1 hour.  The patient has continued to deal with significant chronic migraines that have worsened recently.  The patient has significant sleep disturbance as well.  The patient's migraines started in her 79s but she had been able to manage them fairly well until more recently.  Recent MRI did not show any significant abnormalities.  The patient reports that she has done better with some of her migraine medications if she starts taking them soon enough.  We continue to work on therapeutic interventions particular around issues of her ongoing sleep disturbance and other therapeutic interventions to help manage her recurrent migraines, depression and other symptoms.

## 2019-02-27 DIAGNOSIS — O021 Missed abortion: Secondary | ICD-10-CM | POA: Diagnosis not present

## 2019-03-03 ENCOUNTER — Other Ambulatory Visit: Payer: Self-pay

## 2019-03-03 ENCOUNTER — Encounter: Payer: Self-pay | Admitting: Psychology

## 2019-03-03 ENCOUNTER — Encounter: Payer: BC Managed Care – PPO | Attending: Psychology | Admitting: Psychology

## 2019-03-03 DIAGNOSIS — G43501 Persistent migraine aura without cerebral infarction, not intractable, with status migrainosus: Secondary | ICD-10-CM | POA: Insufficient documentation

## 2019-03-03 DIAGNOSIS — F331 Major depressive disorder, recurrent, moderate: Secondary | ICD-10-CM | POA: Insufficient documentation

## 2019-03-03 NOTE — Progress Notes (Signed)
03/03/2019.  Today was an inpatient visit with the patient and myself conducted in my outpatient clinic office.  The patient reports that she has been actively working on some of the initial coping mechanisms we have around her recurrent migraine and headache events and depressive events.  The patient has been using excessive amounts of stimulants over the past many years to try to cope and manage with fatigue that have likely been exacerbating her migraine events.  The patient has been using caffeinated sodas, tea and coffee excessively as well as regularly using 5-hour energy drinks and other stimulant drinks.  We worked on a plan to decrease these alkaloid based chemicals over time as they are likely having an exacerbating effect on her overall functioning.  We have also talked about other behavioral changes to begin implementing.  Once we have decreased these excessive amounts of stimulants that she is taking it would then be time to start looking at psychotropic interventions for her depression.  I do think that the patient would likely benefit from an SSRI based medication such as Prozac, Celexa or Lexapro.  I will leave that up to her PCP.  The patient had previously taken Wellbutrin in which she developed hives.  However, this reaction she had during her attempted use of Wellbutrin/bupropion also occurred when she was using excessive amounts of stimulant over-the-counter products.  Looking extensively at some of her depressive symptoms before she began excessive use of these other products and given her other medical history I do think that a more clean SSRI medication would likely be the most appropriate approach initially.

## 2019-03-06 DIAGNOSIS — O021 Missed abortion: Secondary | ICD-10-CM | POA: Diagnosis not present

## 2019-03-13 DIAGNOSIS — O021 Missed abortion: Secondary | ICD-10-CM | POA: Diagnosis not present

## 2019-03-17 ENCOUNTER — Other Ambulatory Visit: Payer: Self-pay

## 2019-03-17 ENCOUNTER — Encounter (HOSPITAL_BASED_OUTPATIENT_CLINIC_OR_DEPARTMENT_OTHER): Payer: BC Managed Care – PPO | Admitting: Psychology

## 2019-03-17 DIAGNOSIS — G5772 Causalgia of left lower limb: Secondary | ICD-10-CM | POA: Diagnosis not present

## 2019-03-17 DIAGNOSIS — F331 Major depressive disorder, recurrent, moderate: Secondary | ICD-10-CM | POA: Diagnosis not present

## 2019-03-17 DIAGNOSIS — G90521 Complex regional pain syndrome I of right lower limb: Secondary | ICD-10-CM | POA: Diagnosis not present

## 2019-03-17 DIAGNOSIS — G43501 Persistent migraine aura without cerebral infarction, not intractable, with status migrainosus: Secondary | ICD-10-CM

## 2019-03-27 ENCOUNTER — Encounter: Payer: Self-pay | Admitting: Psychology

## 2019-03-27 NOTE — Progress Notes (Signed)
03/03/2019.  Today was an inpatient visit with the patient and myself conducted in my outpatient clinic office.  The patient reports that she has been actively working on some of the initial coping mechanisms we have around her recurrent migraine and headache events and depressive events.  The patient has been using excessive amounts of stimulants over the past many years to try to cope and manage with fatigue that have likely been exacerbating her migraine events.  The patient has been using caffeinated sodas, tea and coffee excessively as well as regularly using 5-hour energy drinks and other stimulant drinks.  We worked on a plan to decrease these alkaloid based chemicals over time as they are likely having an exacerbating effect on her overall functioning.  We have also talked about other behavioral changes to begin implementing.  Once we have decreased these excessive amounts of stimulants that she is taking it would then be time to start looking at psychotropic interventions for her depression.  I do think that the patient would likely benefit from an SSRI based medication such as Prozac, Celexa or Lexapro.  I will leave that up to her PCP.  The patient had previously taken Wellbutrin in which she developed hives.  However, this reaction she had during her attempted use of Wellbutrin/bupropion also occurred when she was using excessive amounts of stimulant over-the-counter products.  Looking extensively at some of her depressive symptoms before she began excessive use of these other products and given her other medical history I do think that a more clean SSRI medication would likely be the most appropriate approach initially.    03/17/2019: Today's visit was a 1 hour face-to-face clinical interview with the patient there was conducted in person with the patient and myself.  It lasted 1 hour.  The patient continues to have significant issues around recurrent migraine and depressive events.  The patient  continues to show significant improvement.  She has made significant reductions in caffeine use and at this point it has not had a significant impact on her migraine frequency but she is just now getting to that point.  The patient remains highly motivated and active and working on these issues related to reducing her caffeine use.  She is going back to her PCP to discuss issues of starting an SSRI medication.

## 2019-03-30 DIAGNOSIS — Z113 Encounter for screening for infections with a predominantly sexual mode of transmission: Secondary | ICD-10-CM | POA: Diagnosis not present

## 2019-03-30 DIAGNOSIS — Z130 Encounter for screening for diseases of the blood and blood-forming organs and certain disorders involving the immune mechanism: Secondary | ICD-10-CM | POA: Diagnosis not present

## 2019-03-30 DIAGNOSIS — Z1231 Encounter for screening mammogram for malignant neoplasm of breast: Secondary | ICD-10-CM | POA: Diagnosis not present

## 2019-03-30 DIAGNOSIS — Z124 Encounter for screening for malignant neoplasm of cervix: Secondary | ICD-10-CM | POA: Diagnosis not present

## 2019-03-30 DIAGNOSIS — Z01419 Encounter for gynecological examination (general) (routine) without abnormal findings: Secondary | ICD-10-CM | POA: Diagnosis not present

## 2019-03-30 DIAGNOSIS — Z6828 Body mass index (BMI) 28.0-28.9, adult: Secondary | ICD-10-CM | POA: Diagnosis not present

## 2019-03-30 DIAGNOSIS — R102 Pelvic and perineal pain: Secondary | ICD-10-CM | POA: Diagnosis not present

## 2019-04-03 LAB — HM PAP SMEAR

## 2019-04-13 DIAGNOSIS — Z1231 Encounter for screening mammogram for malignant neoplasm of breast: Secondary | ICD-10-CM | POA: Diagnosis not present

## 2019-04-24 ENCOUNTER — Other Ambulatory Visit: Payer: Self-pay

## 2019-04-24 ENCOUNTER — Ambulatory Visit
Admission: EM | Admit: 2019-04-24 | Discharge: 2019-04-24 | Disposition: A | Discharge status: Home / Self Care | Payer: BC Managed Care – PPO | Attending: Physician Assistant | Admitting: Physician Assistant

## 2019-04-24 DIAGNOSIS — B373 Candidiasis of vulva and vagina: Secondary | ICD-10-CM | POA: Diagnosis not present

## 2019-04-24 DIAGNOSIS — B3731 Acute candidiasis of vulva and vagina: Secondary | ICD-10-CM

## 2019-04-24 MED ORDER — FLUCONAZOLE 150 MG PO TABS: 150.0000 mg | ORAL_TABLET | Freq: Every day | ORAL | 0 refills | Status: DC

## 2019-04-24 NOTE — ED Notes (Signed)
Patient able to ambulate independently  

## 2019-04-24 NOTE — ED Provider Notes (Signed)
[name]    CSN: [id number] Arrival date & time: 04/24/19  1739      History   Chief Complaint Chief Complaint  Patient presents with  . Vaginitis    HPI Kelly Walsh is a 38 y.o. female.   38 year old female comes in for 1 week history of vaginal itching, discharge.  Patient states has history of frequent BV, yeast infections.  Has had multiple evaluations in the past.  Has not found triggers for these BV/yeast flareups.  Patient states usually with yeast, she started with vaginal itching, that then developed to the vaginal burning, and clumpy discharge.  If this is not treated, itching can spread to the buttocks.  She denies any abdominal pain, nausea, vomiting, diarrhea.  Denies fever, chills, body aches.  Denies urinary symptoms such as frequency, dysuria, hematuria.  LMP 03/30/2019.  Not currently sexually active.   Patient denies history of diabetes, however, given frequent yeast infections, GYN tried her on Metformin to see if this could control symptoms.  However, has severe side effect from Metformin and therefore discontinued.  She uses same hygiene products, without any fragrance.  She denies wearing any tight pants.  States has tried changing diet without any help as well.  States she has tried Monistat in the past, and has never improved her symptoms, and therefore has not tried at this time.     Past Medical History:  Diagnosis Date  . Anemia    H/O  . Hx of migraines   . IBS (irritable bowel syndrome)     Patient Active Problem List   Diagnosis Date Noted  . Migraine headache 08/25/2018  . Fibroids 08/25/2018  . Allergic rhinitis 08/25/2018  . Complex regional pain syndrome of lower limb 10/07/2017    Past Surgical History:  Procedure Laterality Date  . FOOT SURGERY    . WISDOM TOOTH EXTRACTION      OB History    Gravida  0   Para  0   Term  0   Preterm  0   AB  0   Living  0     SAB  0   TAB  0   Ectopic  0   Multiple  0   Live Births               Home Medications    Prior to Admission medications   Medication Sig Start Date End Date Taking? Authorizing Provider  SUMAtriptan (IMITREX) 100 MG tablet Take 1 tablet earliest onset of migraine.  May repeat in 2 hours if headache persists or recurs.  Maximum 2 tablets in 24 hours 12/24/18  Yes Jaffe, Adam R, DO  fluconazole (DIFLUCAN) 150 MG tablet Take 1 tablet (150 mg total) by mouth daily. Take second dose 72 hours later if symptoms still persists. 04/24/19   Tasia Catchings, Violett Hobbs V, PA-C  fluticasone (FLONASE) 50 MCG/ACT nasal spray Place 2 sprays into the nose daily.    [provider]  loratadine (CLARITIN) 10 MG tablet Take 10 mg by mouth daily.    [provider]  naproxen sodium (ALEVE) 220 MG tablet Take 220 mg by mouth as needed.    [provider]  nortriptyline (PAMELOR) 10 MG capsule Take 1 capsule (10 mg total) by mouth at bedtime. 12/24/18   Pieter Partridge, DO  Probiotic Product (PROBIOTIC DAILY PO) Take by mouth.    [provider]    Family History Family History  Problem Relation  Age of Onset  . Diabetes Father   . Hypertension Mother   . Lupus Cousin     Social History Social History   Tobacco Use  . Smoking status: Never Smoker  . Smokeless tobacco: Never Used  Substance Use Topics  . Alcohol use: No  . Drug use: No     Allergies   Bupropion, Metformin, Oxycodone-acetaminophen, Tramadol, Codeine, and Oxycodone   Review of Systems Review of Systems  Reason unable to perform ROS: See HPI as above.     Physical Exam Triage Vital Signs ED Triage Vitals  Enc Vitals Group     BP 04/24/19 1750 115/78     Pulse Rate 04/24/19 1750 (!) 102     Resp --      Temp 04/24/19 1750 97.9 F (36.6 C)     Temp Source 04/24/19 1750 Tympanic     SpO2 04/24/19 1750 98 %     Weight 04/24/19 1752 165 lb (74.8 kg)     Height --      Head Circumference --      Peak Flow --      Pain Score  04/24/19 1750 7     Pain Loc --      Pain Edu? --      Excl. in [place]? --    No data found.  Updated Vital Signs BP 115/78 (BP Location: Left Arm)   Pulse (!) 102   Temp 97.9 F (36.6 C) (Tympanic)   Wt 165 lb (74.8 kg)   LMP 03/30/2019 (Exact Date)   SpO2 98%   BMI 27.46 kg/m   Physical Exam Constitutional:      General: She is not in acute distress.    Appearance: Normal appearance. She is well-developed. She is not diaphoretic.  HENT:     Head: Normocephalic and atraumatic.  Eyes:     Conjunctiva/sclera: Conjunctivae normal.     Pupils: Pupils are equal, round, and reactive to light.  Pulmonary:     Effort: Pulmonary effort is normal. No respiratory distress.  Skin:    General: Skin is warm and dry.  Neurological:     Mental Status: She is alert and oriented to person, place, and time.      UC Treatments / Results  Labs (all labs ordered are listed, but only abnormal results are displayed) Labs Reviewed - No data to display  EKG   Radiology No results found.  Procedures Procedures (including critical care time)  Medications Ordered in UC Medications - No data to display  Initial Impression / Assessment and Plan / UC Course  I have reviewed the triage vital signs and the nursing notes.  Pertinent labs & imaging results that were available during my care of the patient were reviewed by me and considered in my medical decision making (see chart for details).     Patient with history of recurrent yeast infections without known trigger.  Has had work-up in the past without any obvious solutions.  Knows her symptoms well, and knowledgeable on what to avoid.  Will call in Diflucan at this time.  Return precautions given.  Patient expresses understanding and agrees to plan.  Final Clinical Impressions(s) / UC Diagnoses   Final diagnoses:  Yeast vaginitis   ED Prescriptions    Medication Sig Dispense Auth. Provider   fluconazole (DIFLUCAN) 150 MG tablet  Take 1 tablet (150 mg total) by mouth daily. Take second dose 72 hours later if symptoms still persists. 2 tablet Tasia Catchings,  Shila Kruczek V, PA-C     PDMP not reviewed this encounter.   Ok Edwards, PA-C 04/24/19 1912

## 2019-04-24 NOTE — ED Triage Notes (Addendum)
Pt. States she has had vaginal burning, irritation for about a week. She has had a hx of yeast infections.

## 2019-04-24 NOTE — Discharge Instructions (Addendum)
Diflucan called into pharmacy. Follow up with PCP for further evaluation needed.

## 2019-04-28 NOTE — Progress Notes (Signed)
Virtual Visit via Video Note The purpose of this virtual visit is to provide medical care while limiting exposure to the novel coronavirus.    Consent was obtained for video visit:  Yes.   Answered questions that patient had about telehealth interaction:  Yes.   I discussed the limitations, risks, security and privacy concerns of performing an evaluation and management service by telemedicine. I also discussed with the patient that there may be a patient responsible charge related to this service. The patient expressed understanding and agreed to proceed.  Pt location: Home Physician Location: office Name of referring provider:  Marrian Salvage,* I connected with Kathrin Greathouse Nyborg at patients initiation/request on 04/29/2019 at  3:30 PM EDT by video enabled telemedicine application and verified that I am speaking with the correct person using two identifiers. Pt MRN:  [id number] Pt DOB:  12-01-1980 Video Participants:  Kathrin Greathouse Simonian   History of Present Illness:  Kelly Walsh is a 38 year old woman who follows up for migraine.  UPDATE: She decided to not take nortriptyline and tried lifestyle modification to improve headaches.  She scaled back on the energy drinks, increased exercise, and improved her sleep hygiene and diet.  She was doing well until a change in work over the past 2 weeks, which has affected her diet and exercise and sleep.  She has had frequent migraines over the past couple of weeks, maybe 5 days in a week.  They do respond well to sumatriptan.  The severe migraine may last no more than an hour and then gradually tapers off over the next few hours.  They no longer last 3 days.  She started the nortriptyline last week.  She reports dry mouth but otherwise tolerating it.    Current NSAIDS:  naproxen Current analgesics:  none Current triptans:  Sumatriptan 100mg   Current ergotamine:  none Current anti-emetic:  none Current muscle relaxants:  none Current  anti-anxiolytic:  none Current sleep aide:  none Current Antihypertensive medications:  none Current Antidepressant medications:  Nortriptyline 10mg  at bedtime Current Anticonvulsant medications:  none Current anti-CGRP:  none Current Vitamins/Herbal/Supplements:  none Current Antihistamines/Decongestants:  Claritin, Flonase Other therapy:  none  Caffeine:  1/2 cup of coffee but not daily.  Rarely has a sip of 5 hour energy drink Diet:  Hydrates with water.   Exercise:  Not routine but just started to exercise. Depression:  some; Anxiety:  yes Other pain:  Back pain, right shoulder pain Sleep hygiene:  Varies.  History of chronic fatigue.  She was supposed to have a sleep study but was never able to keep the appointment.    HISTORY: Onset: Since her early 40s.  They were fairly well controlled and responded to Aleve until gradually getting worse in 2018 in which they have become more frequent and not responding to medication. Location:  bifrontal Quality:  sharp Initial intensity:  Severe.  She denies new headache, thunderclap headache Aura:  no Premonitory Phase:  no Postdrome:  no Associated symptoms: Photophobia, phonophobia, osmophobia.  She denies associated nausea, vomiting, visual disturbance, or unilateral numbness or weakness. Initial duration:  Quick onset.  Usually 3 to 4 days Initial Frequency:  At least once a month Triggers: Unknown Relieving factors: Air conditioning, ice pack, rest in quiet dark room Activity:  Cannot function for 3 days  MRI of brain with and without contrast from 01/27/14 was normal.  MRI of brain with and without contrast from 09/07/18 personally reviewed and was normal.  Past NSAIDS:  ibuprofen Past analgesics: Excedrin Migraine, Tylenol Past abortive triptans:  Maxalt Past abortive ergotamine:  none Past muscle relaxants:  none Past anti-emetic:  Zofran 4mg  Past antihypertensive medications:  none Past antidepressant medications:  none  Past anticonvulsant medications:  topiramate 25mg  twice daily (stopped due to UTI, cloudy urine and ineffective) Past anti-CGRP:  none Past vitamins/Herbal/Supplements:  none Past antihistamines/decongestants:  none Other past therapies:  none   Family history of headache:  No  Past Medical History: Past Medical History:  Diagnosis Date  . Anemia    H/O  . Hx of migraines   . IBS (irritable bowel syndrome)     Medications: Outpatient Encounter Medications as of 04/29/2019  Medication Sig  . fluconazole (DIFLUCAN) 150 MG tablet Take 1 tablet (150 mg total) by mouth daily. Take second dose 72 hours later if symptoms still persists.  . fluticasone (FLONASE) 50 MCG/ACT nasal spray Place 2 sprays into the nose daily.  Marland Kitchen loratadine (CLARITIN) 10 MG tablet Take 10 mg by mouth daily.  . naproxen sodium (ALEVE) 220 MG tablet Take 220 mg by mouth as needed.  . nortriptyline (PAMELOR) 10 MG capsule Take 1 capsule (10 mg total) by mouth at bedtime.  . Probiotic Product (PROBIOTIC DAILY PO) Take by mouth.  . SUMAtriptan (IMITREX) 100 MG tablet Take 1 tablet earliest onset of migraine.  May repeat in 2 hours if headache persists or recurs.  Maximum 2 tablets in 24 hours   No facility-administered encounter medications on file as of 04/29/2019.     Allergies: Allergies  Allergen Reactions  . Bupropion Hives  . Metformin Nausea Only  . Oxycodone-Acetaminophen Itching  . Tramadol Itching  . Codeine   . Oxycodone     Family History: Family History  Problem Relation Age of Onset  . Diabetes Father   . Hypertension Mother   . Lupus Cousin     Social History: Social History   Socioeconomic History  . Marital status: Single    Spouse name: Not on file  . Number of children: Not on file  . Years of education: Not on file  . Highest education level: Not on file  Occupational History  . Not on file  Social Needs  . Financial resource strain: Not on file  . Food insecurity     Worry: Not on file    Inability: Not on file  . Transportation needs    Medical: Not on file    Non-medical: Not on file  Tobacco Use  . Smoking status: Never Smoker  . Smokeless tobacco: Never Used  Substance and Sexual Activity  . Alcohol use: No  . Drug use: No  . Sexual activity: Yes    Birth control/protection: Condom  Lifestyle  . Physical activity    Days per week: Not on file    Minutes per session: Not on file  . Stress: Not on file  Relationships  . Social Herbalist on phone: Not on file    Gets together: Not on file    Attends religious service: Not on file    Active member of club or organization: Not on file    Attends meetings of clubs or organizations: Not on file    Relationship status: Not on file  . Intimate partner violence    Fear of current or ex partner: Not on file    Emotionally abused: Not on file    Physically abused: Not on file  Forced sexual activity: Not on file  Other Topics Concern  . Not on file  Social History Narrative   Right handed, lives in an apartment on 2nd floor alone.    Observations/Objective:   Height 5\' 5"  (1.651 m), weight 170 lb (77.1 kg), last menstrual period 03/30/2019. No acute distress.  Alert and oriented.  Speech fluent and not dysarthric.  Language intact.  Eyes orthophoric on primary gaze.  Face symmetric.  Assessment and Plan:   Chronic migraine without aura, without status migrainosus, not intractable.  1.  For preventative management, she will continue nortriptyline 10mg  at bedtime.  If not significantly improved in 4 to 6 weeks, she will contact me and we can increase dose to 25mg  at bedtime.  If she has intolerable adverse side effects, she will contact me and change to a different preventative. 2.  For abortive therapy, sumatriptan 100mg  3.  Limit use of pain relievers to no more than 2 days out of week to prevent risk of rebound or medication-overuse headache. 4.  Keep headache diary 5.   Exercise, hydration, caffeine cessation, sleep hygiene, monitor for and avoid triggers 6.  Consider:  magnesium citrate 400mg  daily, riboflavin 400mg  daily, and coenzyme Q10 100mg  three times daily 7. Follow up 4 months  Follow Up Instructions:    -I discussed the assessment and treatment plan with the patient. The patient was provided an opportunity to ask questions and all were answered. The patient agreed with the plan and demonstrated an understanding of the instructions.   The patient was advised to call back or seek an in-person evaluation if the symptoms worsen or if the condition fails to improve as anticipated.    Dudley Major, DO

## 2019-04-29 ENCOUNTER — Encounter: Payer: Self-pay | Admitting: Neurology

## 2019-04-29 ENCOUNTER — Other Ambulatory Visit: Payer: Self-pay

## 2019-04-29 ENCOUNTER — Telehealth (INDEPENDENT_AMBULATORY_CARE_PROVIDER_SITE_OTHER): Payer: BC Managed Care – PPO | Admitting: Neurology

## 2019-04-29 VITALS — Ht 65.0 in | Wt 170.0 lb

## 2019-04-29 DIAGNOSIS — G43009 Migraine without aura, not intractable, without status migrainosus: Secondary | ICD-10-CM

## 2019-05-13 ENCOUNTER — Telehealth: Payer: BC Managed Care – PPO | Admitting: Nurse Practitioner

## 2019-05-13 DIAGNOSIS — B373 Candidiasis of vulva and vagina: Secondary | ICD-10-CM

## 2019-05-13 DIAGNOSIS — B3731 Acute candidiasis of vulva and vagina: Secondary | ICD-10-CM

## 2019-05-13 MED ORDER — FLUCONAZOLE 150 MG PO TABS: ORAL_TABLET | ORAL | 0 refills | Status: DC

## 2019-05-13 MED ORDER — FLUCONAZOLE 150 MG PO TABS: 150.0000 mg | ORAL_TABLET | Freq: Once | ORAL | 0 refills | Status: DC

## 2019-05-13 NOTE — Progress Notes (Signed)
We are sorry that you are not feeling well. Here is how we plan to help! Based on what you shared with me it looks like you: May have a yeast vaginosis  Vaginosis is an inflammation of the vagina that can result in discharge, itching and pain. The cause is usually a change in the normal balance of vaginal bacteria or an infection. Vaginosis can also result from reduced estrogen levels after menopause.  The most common causes of vaginosis are:   Bacterial vaginosis which results from an overgrowth of one on several organisms that are normally present in your vagina.   Yeast infections which are caused by a naturally occurring fungus called candida.   Vaginal atrophy (atrophic vaginosis) which results from the thinning of the vagina from reduced estrogen levels after menopause.   Trichomoniasis which is caused by a parasite and is commonly transmitted by sexual intercourse.  Factors that increase your risk of developing vaginosis include: Marland Kitchen Medications, such as antibiotics and steroids . Uncontrolled diabetes . Use of hygiene products such as bubble bath, vaginal spray or vaginal deodorant . Douching . Wearing damp or tight-fitting clothing . Using an intrauterine device (IUD) for birth control . Hormonal changes, such as those associated with pregnancy, birth control pills or menopause . Sexual activity . Having a sexually transmitted infection  Your treatment plan is A single Diflucan (fluconazole) 150mg  tablet once.  I have electronically sent this prescription into the pharmacy that you have chosen. you can use monistat OTC on th eoutside.  Be sure to take all of the medication as directed. Stop taking any medication if you develop a rash, tongue swelling or shortness of breath. Mothers who are breast feeding should consider pumping and discarding their breast milk while on these antibiotics. However, there is no consensus that infant exposure at these doses would be harmful.  Remember  that medication creams can weaken latex condoms. Marland Kitchen   HOME CARE:  Good hygiene may prevent some types of vaginosis from recurring and may relieve some symptoms:  . Avoid baths, hot tubs and whirlpool spas. Rinse soap from your outer genital area after a shower, and dry the area well to prevent irritation. Don't use scented or harsh soaps, such as those with deodorant or antibacterial action. Marland Kitchen Avoid irritants. These include scented tampons and pads. . Wipe from front to back after using the toilet. Doing so avoids spreading fecal bacteria to your vagina.  Other things that may help prevent vaginosis include:  Marland Kitchen Don't douche. Your vagina doesn't require cleansing other than normal bathing. Repetitive douching disrupts the normal organisms that reside in the vagina and can actually increase your risk of vaginal infection. Douching won't clear up a vaginal infection. . Use a latex condom. Both female and female latex condoms may help you avoid infections spread by sexual contact. . Wear cotton underwear. Also wear pantyhose with a cotton crotch. If you feel comfortable without it, skip wearing underwear to bed. Yeast thrives in [employer] Your symptoms should improve in the next day or two.  GET HELP RIGHT AWAY IF:  . You have pain in your lower abdomen ( pelvic area or over your ovaries) . You develop nausea or vomiting . You develop a fever . Your discharge changes or worsens . You have persistent pain with intercourse . You develop shortness of breath, a rapid pulse, or you faint.  These symptoms could be signs of problems or infections that need to be evaluated by a medical  provider now.  MAKE SURE YOU    Understand these instructions.  Will watch your condition.  Will get help right away if you are not doing well or get worse.  Your e-visit answers were reviewed by a board certified advanced clinical practitioner to complete your personal care plan. Depending upon the  condition, your plan could have included both over the counter or prescription medications. Please review your pharmacy choice to make sure that you have choses a pharmacy that is open for you to pick up any needed prescription, Your safety is important to [place]. If you have drug allergies check your prescription carefully.   You can use MyChart to ask questions about today's visit, request a non-urgent call back, or ask for a work or school excuse for 24 hours related to this e-Visit. If it has been greater than 24 hours you will need to follow up with your provider, or enter a new e-Visit to address those concerns. You will get a MyChart message within the next two days asking about your experience. I hope that your e-visit has been valuable and will speed your recovery.  5-10 minutes spent reviewing and documenting in chart.

## 2019-05-13 NOTE — Addendum Note (Signed)
Addended by: Chevis Pretty on: 05/13/2019 05:46 PM   Modules accepted: Orders

## 2019-05-15 ENCOUNTER — Encounter: Payer: Self-pay | Admitting: Family Medicine

## 2019-05-15 ENCOUNTER — Ambulatory Visit (INDEPENDENT_AMBULATORY_CARE_PROVIDER_SITE_OTHER): Payer: BC Managed Care – PPO

## 2019-05-15 ENCOUNTER — Ambulatory Visit: Payer: BC Managed Care – PPO | Admitting: Family

## 2019-05-15 ENCOUNTER — Ambulatory Visit (INDEPENDENT_AMBULATORY_CARE_PROVIDER_SITE_OTHER): Payer: BC Managed Care – PPO | Admitting: Family Medicine

## 2019-05-15 ENCOUNTER — Other Ambulatory Visit: Payer: Self-pay

## 2019-05-15 VITALS — BP 114/80 | HR 104 | Ht 65.0 in | Wt 172.8 lb

## 2019-05-15 DIAGNOSIS — L293 Anogenital pruritus, unspecified: Secondary | ICD-10-CM | POA: Diagnosis not present

## 2019-05-15 DIAGNOSIS — M25511 Pain in right shoulder: Secondary | ICD-10-CM

## 2019-05-15 DIAGNOSIS — G8929 Other chronic pain: Secondary | ICD-10-CM | POA: Diagnosis not present

## 2019-05-15 DIAGNOSIS — Z23 Encounter for immunization: Secondary | ICD-10-CM

## 2019-05-15 MED ORDER — DICLOFENAC SODIUM 1 % EX GEL: 4.0000 g | Freq: Four times a day (QID) | CUTANEOUS | 11 refills | Status: DC

## 2019-05-15 NOTE — Progress Notes (Signed)
Subjective:    CC: R shoulder pain  I, Kelly Walsh, LAT, ATC, am serving as scribe for Dr. Lynne Leader.  HPI: Pt is a 38 y/o female presenting w/ c/o R shoulder pain x one year w/ no known MOI.  She states that she was carrying a few heavier bags on her R shoulder on a regular basis near the end of 2019  She notes that she would experience some aching pain when she carried the bags but reports that this would resolve fairly quickly.  Her R shoulder pain continued to worsen and now she has pain w/ horizontal aBd and lifting items such as lifting a cup up out of her car.  She reports little to no pain at rest and rates her pain as a sharp 7/10 pain it's worst.  Pt denies any mechanical symptoms.  She does have some radiating pain into her R upper deltoid.  She denies any neck pain or sensory changes in her R UE.  Pt has tried Aleve and heat w/ no relief noted.  Past medical history, Surgical history, Family history not pertinant except as noted below, Social history, Allergies, and medications have been entered into the medical record, reviewed, and no changes needed.   Review of Systems: No headache, visual changes, nausea, vomiting, diarrhea, constipation, dizziness, abdominal pain, skin rash, fevers, chills, night sweats, weight loss, swollen lymph nodes, body aches, joint swelling, muscle aches, chest pain, shortness of breath, mood changes, visual or auditory hallucinations.   Objective:    Vitals:   05/15/19 1039  BP: 114/80  Pulse: (!) 104  SpO2: 98%   General: Well Developed, well nourished, and in no acute distress.  Neuro/Psych: Alert and oriented x3, extra-ocular muscles intact, able to move all 4 extremities, sensation grossly intact. Skin: Warm and dry, no rashes noted.  Respiratory: Not using accessory muscles, speaking in full sentences, trachea midline.  Cardiovascular: Pulses palpable, no extremity edema. Abdomen: Does not appear distended. MSK: C-spine: Nontender  to midline.  Normal cervical motion.  Right shoulder normal-appearing not particular tender to palpation. Normal abduction range of motion painful arc at 70 degrees. Internal rotation to lumbar spine. Full external rotation. Strength intact abduction external and internal rotation pain with abduction. Positive empty can test. Positive Hawkins and Neer's test. Positive crossover arm compression test. Negative Yergason's and speeds test.  Left shoulder normal-appearing Normal range of motion. Nontender. Normal strength Negative impingement testing Negative biceps tendinitis testing.  Pulses cap refill and sensation intact bilateral upper extremities.  Lab and Radiology Results  Limited musculoskeletal ultrasound right shoulder  Biceps tendon normal-appearing intact in groove. Subscapularis tendon normal-appearing Supraspinatus tendon normal-appearing with thickened subacromial bursa present. Infraspinatus tendon normal-appearing AC joint mild effusion. Normal bony structures otherwise. Impression subacromial bursitis.  Procedure: Real-time Ultrasound Guided Injection of right subacromial bursa Device: Philips Affiniti 50G Images permanently stored and available for review in the ultrasound unit. Verbal informed consent obtained.  Discussed risks and benefits of procedure. Warned about infection bleeding damage to structures skin hypopigmentation and fat atrophy among others. Patient expresses understanding and agreement Time-out conducted.   Noted no overlying erythema, induration, or other signs of local infection.   Skin prepped in a sterile fashion.   Local anesthesia: Topical Ethyl chloride.   With sterile technique and under real time ultrasound guidance:  40 mg of Kenalog and 2 mL of Marcaine injected easily.   Completed without difficulty   Pain did not completely resolve following injection  Advised to call if fevers/chills, erythema, induration, drainage, or  persistent bleeding.   Images permanently stored and available for review in the ultrasound unit.  Impression: Technically successful ultrasound guided injection.       Impression and Recommendations:    Assessment and Plan: 38 y.o. female with right shoulder pain.  Occurring with gradual onset over the last year.  Likely subacromial bursitis or impingement.  Possible coracoid impingement also present.  Patient did not have complete resolution of pain following injection indicating pain generator may be more internal to the shoulder capsule or possible coracoid impingement.  Plan for diclofenac gel and trial of physical therapy.  Check back in about 1 month.  Return sooner if needed.  Additionally influenza vaccine was given today during her visit per patient request..  PDMP not reviewed this encounter. Orders Placed This Encounter  Procedures  . [place] - Upper Extremity - Limited - RIGHT    Order Specific Question:   Reason for Exam (SYMPTOM  OR DIAGNOSIS REQUIRED)    Answer:   R shoulder pain    Order Specific Question:   Preferred imaging location?    Answer:   [place]  . Flu Vaccine QUAD 36+ mos IM  . Ambulatory referral to Physical Therapy    Referral Priority:   Routine    Referral Type:   Physical Medicine    Referral Reason:   Specialty Services Required    Requested Specialty:   Physical Therapy   Meds ordered this encounter  Medications  . diclofenac Sodium (VOLTAREN) 1 % GEL    Sig: Apply 4 g topically 4 (four) times daily. To affected joint.    Dispense:  100 g    Refill:  11    Discussed warning signs or symptoms. Please see discharge instructions. Patient expresses understanding.   The above documentation has been reviewed and is accurate and complete Lynne Leader

## 2019-05-15 NOTE — Patient Instructions (Addendum)
Thank you for coming in today. Attend PT.  Call or go to the ER if you develop a large red swollen joint with extreme pain or oozing puss.  Recheck in 4 weeks.  Return sooner if needed.    Shoulder Impingement Syndrome  Shoulder impingement syndrome is a condition that causes pain when connective tissues (tendons) surrounding the shoulder joint become pinched. These tendons are part of the group of muscles and tissues that help to stabilize the shoulder (rotator cuff). Beneath the rotator cuff is a fluid-filled sac (bursa) that allows the muscles and tendons to glide smoothly. The bursa may become swollen or irritated (bursitis). Bursitis, swelling in the rotator cuff tendons, or both conditions can decrease how much space is under a bone in the shoulder joint (acromion), resulting in impingement. What are the causes? Shoulder impingement syndrome may be caused by bursitis or swelling of the rotator cuff tendons, which may result from:  Repetitive overhead arm movements.  Falling onto the shoulder.  Weakness in the shoulder muscles. What increases the risk? You may be more likely to develop this condition if you:  Play sports that involve throwing, such as baseball.  Participate in sports such as tennis, volleyball, and swimming.  Work as a Curator, Games developer, or Architect. Some people are also more likely to develop impingement syndrome because of the shape of their acromion bone. What are the signs or symptoms? The main symptom of this condition is pain on the front or side of the shoulder. The pain may:  Get worse when lifting or raising the arm.  Get worse at night.  Wake you up from sleeping.  Feel sharp when the shoulder is moved and then fade to an ache. Other symptoms may include:  Tenderness.  Stiffness.  Inability to raise the arm above shoulder level or behind the body.  Weakness. How is this diagnosed? This condition may be diagnosed based on:  Your  symptoms and medical history.  A physical exam.  Imaging tests, such as: ? X-rays. ? MRI. ? Ultrasound. How is this treated? This condition may be treated by:  Resting your shoulder and avoiding all activities that cause pain or put stress on the shoulder.  Icing your shoulder.  NSAIDs to help reduce pain and swelling.  One or more injections of medicines to numb the area and reduce inflammation.  Physical therapy.  Surgery. This may be needed if nonsurgical treatments have not helped. Surgery may involve repairing the rotator cuff, reshaping the acromion, or removing the bursa. Follow these instructions at home: Managing pain, stiffness, and swelling   If directed, put ice on the injured area. ? Put ice in a plastic bag. ? Place a towel between your skin and the bag. ? Leave the ice on for 20 minutes, 2-3 times a day. Activity  Rest and return to your normal activities as told by your health care provider. Ask your health care provider what activities are safe for you.  Do exercises as told by your health care provider. General instructions  Do not use any products that contain nicotine or tobacco, such as cigarettes, e-cigarettes, and chewing tobacco. These can delay healing. If you need help quitting, ask your health care provider.  Ask your health care provider when it is safe for you to drive.  Take over-the-counter and prescription medicines only as told by your health care provider.  Keep all follow-up visits as told by your health care provider. This is important. How is this  prevented?  Give your body time to rest between periods of activity.  Be safe and responsible while being active. This will help you avoid falls.  Maintain physical fitness, including strength and flexibility. Contact a health care provider if:  Your symptoms have not improved after 1-2 months of treatment and rest.  You cannot lift your arm away from your body. Summary  Shoulder  impingement syndrome is a condition that causes pain when connective tissues (tendons) surrounding the shoulder joint become pinched.  The main symptom of this condition is pain on the front or side of the shoulder.  This condition is usually treated with rest, ice, and pain medicines as needed. This information is not intended to replace advice given to you by your health care provider. Make sure you discuss any questions you have with your health care provider. Document Released: 06/18/2005 Document Revised: 10/10/2018 Document Reviewed: 12/11/2017 Elsevier Patient Education  2020 [employer].

## 2019-06-09 ENCOUNTER — Other Ambulatory Visit: Payer: Self-pay

## 2019-06-09 ENCOUNTER — Encounter: Payer: Self-pay | Admitting: Emergency Medicine

## 2019-06-09 ENCOUNTER — Ambulatory Visit
Admission: EM | Admit: 2019-06-09 | Discharge: 2019-06-09 | Disposition: A | Discharge status: Home / Self Care | Payer: BC Managed Care – PPO | Attending: Physician Assistant | Admitting: Physician Assistant

## 2019-06-09 DIAGNOSIS — Z20828 Contact with and (suspected) exposure to other viral communicable diseases: Secondary | ICD-10-CM | POA: Diagnosis not present

## 2019-06-09 DIAGNOSIS — J069 Acute upper respiratory infection, unspecified: Secondary | ICD-10-CM

## 2019-06-09 MED ORDER — KETOROLAC TROMETHAMINE 30 MG/ML IJ SOLN
30.0000 mg | Freq: Once | INTRAMUSCULAR | Status: AC
  Administered 2019-06-09: 30 mg via INTRAMUSCULAR

## 2019-06-09 MED ORDER — DEXAMETHASONE SODIUM PHOSPHATE 10 MG/ML IJ SOLN
10.0000 mg | Freq: Once | INTRAMUSCULAR | Status: AC
  Administered 2019-06-09: 10 mg via INTRAMUSCULAR

## 2019-06-09 MED ORDER — METOCLOPRAMIDE HCL 5 MG/ML IJ SOLN
5.0000 mg | Freq: Once | INTRAMUSCULAR | Status: AC
  Administered 2019-06-09: 5 mg via INTRAMUSCULAR

## 2019-06-09 NOTE — Discharge Instructions (Signed)
COVID testing ordered. I would like you to quarantine until testing results. Toradol, reglan, decadron injection in office today for migraine. You can take over the counter flonase/nasacort to help with nasal congestion/drainage. If experiencing shortness of breath, trouble breathing, go to the emergency department for further evaluation needed.

## 2019-06-09 NOTE — ED Notes (Signed)
Patient able to ambulate independently  

## 2019-06-09 NOTE — ED Provider Notes (Signed)
[name]    CSN: [id number] Arrival date & time: 06/09/19  1806      History   Chief Complaint No chief complaint on file.   HPI Kelly Walsh is a 38 y.o. female.   38 year old female comes in for 7 day history of headaches. 3 day history of URI symptoms. Has had rhinorrhea, nasal congestion, sore throat, cough. Denies fever, chills, body aches. Nausea without vomiting. Denies abdominal pain, diarrhea. Feels some increased effort in breath. No obvious trouble breathing. Loss of taste/smell. otc cold medicine for symptoms. Works from home. No obvious sick/COVID contact.      Past Medical History:  Diagnosis Date  . Anemia    H/O  . Hx of migraines   . IBS (irritable bowel syndrome)     Patient Active Problem List   Diagnosis Date Noted  . Migraine headache 08/25/2018  . Fibroids 08/25/2018  . Allergic rhinitis 08/25/2018  . Complex regional pain syndrome of lower limb 10/07/2017    Past Surgical History:  Procedure Laterality Date  . FOOT SURGERY    . WISDOM TOOTH EXTRACTION      OB History    Gravida  0   Para  0   Term  0   Preterm  0   AB  0   Living  0     SAB  0   TAB  0   Ectopic  0   Multiple  0   Live Births               Home Medications    Prior to Admission medications   Medication Sig Start Date End Date Taking? Authorizing Provider  diclofenac Sodium (VOLTAREN) 1 % GEL Apply 4 g topically 4 (four) times daily. To affected joint. 05/15/19   Gregor Hams, MD  fluticasone (FLONASE) 50 MCG/ACT nasal spray Place 2 sprays into the nose daily.    [provider]  loratadine (CLARITIN) 10 MG tablet Take 10 mg by mouth daily.    [provider]  naproxen sodium (ALEVE) 220 MG tablet Take 220 mg by mouth as needed.    [provider]  nortriptyline (PAMELOR) 10 MG capsule Take 1 capsule (10 mg total) by mouth at bedtime. 12/24/18   Pieter Partridge, DO  Probiotic Product (PROBIOTIC DAILY  PO) Take by mouth.    [provider]  SUMAtriptan (IMITREX) 100 MG tablet Take 1 tablet earliest onset of migraine.  May repeat in 2 hours if headache persists or recurs.  Maximum 2 tablets in 24 hours 12/24/18   Pieter Partridge, DO    Family History Family History  Problem Relation Age of Onset  . Diabetes Father   . Hypertension Mother   . Lupus Cousin     Social History Social History   Tobacco Use  . Smoking status: Never Smoker  . Smokeless tobacco: Never Used  Substance Use Topics  . Alcohol use: No  . Drug use: No     Allergies   Bupropion, Metformin, Oxycodone-acetaminophen, Tramadol, Codeine, and Oxycodone   Review of Systems Review of Systems  Reason unable to perform ROS: See HPI as above.     Physical Exam Triage Vital Signs ED Triage Vitals  Enc Vitals Group     BP 06/09/19 1823 131/80     Pulse Rate 06/09/19 1823 96     Resp 06/09/19 1823 18     Temp 06/09/19 1823 (!) 97.1 F (  36.2 C)     Temp Source 06/09/19 1823 Temporal     SpO2 06/09/19 1823 97 %     Weight --      Height --      Head Circumference --      Peak Flow --      Pain Score 06/09/19 1825 0     Pain Loc --      Pain Edu? --      Excl. in [place]? --    No data found.  Updated Vital Signs BP 131/80 (BP Location: Left Arm)   Pulse 96   Temp (!) 97.1 F (36.2 C) (Temporal)   Resp 18   LMP 05/27/2019   SpO2 97%   Physical Exam Constitutional:      General: She is not in acute distress.    Appearance: Normal appearance. She is not ill-appearing, toxic-appearing or diaphoretic.  HENT:     Head: Normocephalic and atraumatic.     Nose:     Right Sinus: No maxillary sinus tenderness or frontal sinus tenderness.     Left Sinus: No maxillary sinus tenderness or frontal sinus tenderness.     Mouth/Throat:     Mouth: Mucous membranes are moist.     Pharynx: Oropharynx is clear. Uvula midline.  Eyes:     Extraocular Movements: Extraocular movements intact.      Conjunctiva/sclera: Conjunctivae normal.     Pupils: Pupils are equal, round, and reactive to light.     Comments: No photophobia on exam.  Neck:     Musculoskeletal: Normal range of motion and neck supple.  Cardiovascular:     Rate and Rhythm: Normal rate and regular rhythm.     Heart sounds: Normal heart sounds. No murmur. No friction rub. No gallop.   Pulmonary:     Effort: Pulmonary effort is normal. No accessory muscle usage, prolonged expiration, respiratory distress or retractions.     Comments: Lungs clear to auscultation without adventitious lung sounds. Neurological:     General: No focal deficit present.     Mental Status: She is alert and oriented to person, place, and time.     Coordination: Coordination is intact.     Gait: Gait is intact.      UC Treatments / Results  Labs (all labs ordered are listed, but only abnormal results are displayed) Labs Reviewed  NOVEL CORONAVIRUS, NAA    EKG   Radiology No results found.  Procedures Procedures (including critical care time)  Medications Ordered in UC Medications  metoCLOPramide (REGLAN) injection 5 mg (5 mg Intramuscular Given 06/09/19 1923)  dexamethasone (DECADRON) injection 10 mg (10 mg Intramuscular Given 06/09/19 1924)  ketorolac (TORADOL) 30 MG/ML injection 30 mg (30 mg Intramuscular Given 06/09/19 1924)    Initial Impression / Assessment and Plan / UC Course  I have reviewed the triage vital signs and the nursing notes.  Pertinent labs & imaging results that were available during my care of the patient were reviewed by me and considered in my medical decision making (see chart for details).    Toradol, Reglan, Decadron injection in office today for migraine.  COVID testing sent. Patient to quarantine until testing results return. No alarming signs on exam.  Patient speaking in full sentences without respiratory distress.  Symptomatic treatment discussed.  Push fluids.  Return precautions given.  Patient  expresses understanding and agrees to plan.  Final Clinical Impressions(s) / UC Diagnoses   Final diagnoses:  Viral URI   ED  Prescriptions    None     PDMP not reviewed this encounter.   Ok Edwards, PA-C 06/09/19 1950

## 2019-06-09 NOTE — ED Triage Notes (Addendum)
PT presents to  [facility] for assessment of nasal congestion, sore throat, cough, slight pressure when she breaths, nausea and headaches since 12/1.

## 2019-06-14 LAB — NOVEL CORONAVIRUS, NAA: SARS-CoV-2, NAA: NOT DETECTED

## 2019-06-16 ENCOUNTER — Ambulatory Visit: Payer: BC Managed Care – PPO | Admitting: Family Medicine

## 2019-06-29 ENCOUNTER — Encounter: Payer: Self-pay | Admitting: Anesthesiology

## 2019-06-29 DIAGNOSIS — H04123 Dry eye syndrome of bilateral lacrimal glands: Secondary | ICD-10-CM | POA: Diagnosis not present

## 2019-06-29 DIAGNOSIS — H40023 Open angle with borderline findings, high risk, bilateral: Secondary | ICD-10-CM | POA: Diagnosis not present

## 2019-06-29 DIAGNOSIS — H1045 Other chronic allergic conjunctivitis: Secondary | ICD-10-CM | POA: Diagnosis not present

## 2019-06-30 ENCOUNTER — Encounter: Payer: Self-pay | Admitting: Family Medicine

## 2019-06-30 ENCOUNTER — Ambulatory Visit: Payer: BC Managed Care – PPO | Admitting: Family Medicine

## 2019-06-30 ENCOUNTER — Other Ambulatory Visit: Payer: Self-pay

## 2019-06-30 VITALS — BP 116/80 | HR 105 | Ht 65.0 in | Wt 174.6 lb

## 2019-06-30 DIAGNOSIS — M25511 Pain in right shoulder: Secondary | ICD-10-CM

## 2019-06-30 NOTE — Patient Instructions (Addendum)
Thank you for coming in today. I am happy to see you at any time.  Let me know if you have any problems.    Please perform the exercise program that we have prepared for you and gone over in detail on a daily basis.  In addition to the handout you were provided you can access your program through: [email or web address]   Your unique program code is:  [email or web address]

## 2019-06-30 NOTE — Progress Notes (Signed)
I, Wendy Poet, LAT, ATC, am serving as scribe for Dr. Lynne Leader.  Kelly Walsh is a 38 y.o. female who presents to [place] [facility] today for f/u of R shoulder pain.  Pt was last seen by Dr. Georgina Snell on 06/01/19 and c/o R shoulder pain radiating to the R upper arm, rating it as a 7/10 at it's worst.  Pt had a R shoulder injection at that visit and was referred to outpatient PT.  Since her last visit, pt reports improved R shoulder symptoms and states that she currently is symptom-free.  She states that the injection took approximately one week to take effect.  She reports return of full R shoulder ROM.  She states that she was not able to go to PT due to her work schedule and is interested in getting a HEP.    ROS:  As above  Exam:  BP 116/80 (BP Location: Left Arm, Patient Position: Sitting, Cuff Size: Normal)   Pulse (!) 105   Ht 5\' 5"  (1.651 m)   Wt 174 lb 9.6 oz (79.2 kg)   SpO2 98%   BMI 29.05 kg/m  Wt Readings from Last 5 Encounters:  06/30/19 174 lb 9.6 oz (79.2 kg)  05/15/19 172 lb 12.8 oz (78.4 kg)  04/29/19 170 lb (77.1 kg)  04/24/19 165 lb (74.8 kg)  12/24/18 168 lb (76.2 kg)   General: Well Developed, well nourished, and in no acute distress.  Neuro/Psych: Alert and oriented x3, extra-ocular muscles intact, able to move all 4 extremities, sensation grossly intact. Skin: Warm and dry, no rashes noted.  Respiratory: Not using accessory muscles, speaking in full sentences, trachea midline.  Cardiovascular: Pulses palpable, no extremity edema. Abdomen: Does not appear distended. MSK:  Right shoulder normal-appearing nontender Range of motion full abduction external and internal rotation. Strength 4+/5 abduction.  5/5 external and internal rotation. Negative Hawkins and Neer's test.  Negative empty can test. Negative Yergason's and speeds test.  Left shoulder normal-appearing nontender Full range of motion. Normal strength. Negative  impingement testing. Negative biceps tendinitis testing.  Pulses capillary fill and sensation are intact distal bilateral extremities.      Assessment and Plan: 38 y.o. female with right shoulder pain.  Significant improvement with subacromial bursa injection.  Of note patient did not have immediate relief following subacromial injection indicating the true pain generator is likely elsewhere in the shoulder.  However she is significantly improved today.  Plan to reteach a home exercise program (ATC/PT).  Will continue home exercise as physical therapy is nonverbally feasible at this current time.  Recheck back with me as needed if symptoms return or recur.  [id number]; 15 additional minutes spent for Therapeutic exercises as stated in above notes.  This included exercises focusing on stretching, strengthening, with significant focus on eccentric aspects.   Long term goals include an improvement in range of motion, strength, endurance as well as avoiding reinjury. Patient's frequency would include in 1-2 times a day, 3-5 times a week for a duration of 6-12 weeks.  Proper technique shown and discussed handout in great detail with ATC.  All questions were discussed and answered.    Historical information moved to improve visibility of documentation.  Past Medical History:  Diagnosis Date  . Anemia    H/O  . Hx of migraines   . IBS (irritable bowel syndrome)    Past Surgical History:  Procedure Laterality Date  . FOOT SURGERY    . WISDOM  TOOTH EXTRACTION     Social History   Tobacco Use  . Smoking status: Never Smoker  . Smokeless tobacco: Never Used  Substance Use Topics  . Alcohol use: No   family history includes Diabetes in her father; Hypertension in her mother; Lupus in her cousin.  Medications: Current Outpatient Medications  Medication Sig Dispense Refill  . fluticasone (FLONASE) 50 MCG/ACT nasal spray Place 2 sprays into the nose daily.    Marland Kitchen loratadine (CLARITIN) 10 MG  tablet Take 10 mg by mouth daily.    . naproxen sodium (ALEVE) 220 MG tablet Take 220 mg by mouth as needed.    . nortriptyline (PAMELOR) 10 MG capsule Take 1 capsule (10 mg total) by mouth at bedtime. 30 capsule 3  . Probiotic Product (PROBIOTIC DAILY PO) Take by mouth.    . SUMAtriptan (IMITREX) 100 MG tablet Take 1 tablet earliest onset of migraine.  May repeat in 2 hours if headache persists or recurs.  Maximum 2 tablets in 24 hours 10 tablet 3  . diclofenac Sodium (VOLTAREN) 1 % GEL Apply 4 g topically 4 (four) times daily. To affected joint. (Patient not taking: Reported on 06/30/2019) 100 g 11   No current facility-administered medications for this visit.   Allergies  Allergen Reactions  . Bupropion Hives  . Metformin Nausea Only  . Oxycodone-Acetaminophen Itching  . Tramadol Itching  . Codeine   . Oxycodone       Discussed warning signs or symptoms. Please see discharge instructions. Patient expresses understanding.  The above documentation has been reviewed and is accurate and complete Lynne Leader

## 2019-07-09 ENCOUNTER — Other Ambulatory Visit: Payer: Self-pay | Admitting: Neurology

## 2019-07-10 ENCOUNTER — Other Ambulatory Visit: Payer: Self-pay

## 2019-07-10 ENCOUNTER — Encounter: Payer: BC Managed Care – PPO | Attending: Psychology | Admitting: Psychology

## 2019-07-10 DIAGNOSIS — F331 Major depressive disorder, recurrent, moderate: Secondary | ICD-10-CM

## 2019-07-10 DIAGNOSIS — G90521 Complex regional pain syndrome I of right lower limb: Secondary | ICD-10-CM | POA: Insufficient documentation

## 2019-07-10 DIAGNOSIS — G43501 Persistent migraine aura without cerebral infarction, not intractable, with status migrainosus: Secondary | ICD-10-CM | POA: Diagnosis not present

## 2019-07-10 DIAGNOSIS — G5772 Causalgia of left lower limb: Secondary | ICD-10-CM

## 2019-07-10 DIAGNOSIS — G90523 Complex regional pain syndrome I of lower limb, bilateral: Secondary | ICD-10-CM | POA: Diagnosis not present

## 2019-07-13 ENCOUNTER — Encounter: Payer: Self-pay | Admitting: Psychology

## 2019-07-13 NOTE — Progress Notes (Signed)
03/03/2019.  Today was an inpatient visit with the patient and myself conducted in my outpatient clinic office.  The patient reports that she has been actively working on some of the initial coping mechanisms we have around her recurrent migraine and headache events and depressive events.  The patient has been using excessive amounts of stimulants over the past many years to try to cope and manage with fatigue that have likely been exacerbating her migraine events.  The patient has been using caffeinated sodas, tea and coffee excessively as well as regularly using 5-hour energy drinks and other stimulant drinks.  We worked on a plan to decrease these alkaloid based chemicals over time as they are likely having an exacerbating effect on her overall functioning.  We have also talked about other behavioral changes to begin implementing.  Once we have decreased these excessive amounts of stimulants that she is taking it would then be time to start looking at psychotropic interventions for her depression.  I do think that the patient would likely benefit from an SSRI based medication such as Prozac, Celexa or Lexapro.  I will leave that up to her PCP.  The patient had previously taken Wellbutrin in which she developed hives.  However, this reaction she had during her attempted use of Wellbutrin/bupropion also occurred when she was using excessive amounts of stimulant over-the-counter products.  Looking extensively at some of her depressive symptoms before she began excessive use of these other products and given her other medical history I do think that a more clean SSRI medication would likely be the most appropriate approach initially.    03/17/2019: Today's visit was a 1 hour face-to-face clinical interview with the patient there was conducted in person with the patient and myself.  It lasted 1 hour.  The patient continues to have significant issues around recurrent migraine and depressive events.  The patient  continues to show significant improvement.  She has made significant reductions in caffeine use and at this point it has not had a significant impact on her migraine frequency but she is just now getting to that point.  The patient remains highly motivated and active and working on these issues related to reducing her caffeine use.  She is going back to her PCP to discuss issues of starting an SSRI medication.  07/10/2019: Today's visit was again a 1 hour face-to-face visit that was performed in my outpatient clinic office.  The patient continues to have recurrent migraine and depressive events reports that she has been actively working on the therapeutic interventions but the recurrence of her medical issues exacerbate her depressive events.  The patient is showing improvement in her depression.  The patient has been able to significantly reduce caffeine but she continues to have migraine that are affecting her job and overall quality of life.  The patient wants to continue to look at what ever she can do to try to get her migraines and better management.  We talked about some various preventative medications that may be available that she could talk with her PCP about and we continue to work on therapeutic interventions around managing her migraines.

## 2019-07-23 ENCOUNTER — Encounter: Payer: BC Managed Care – PPO | Admitting: Psychology

## 2019-07-24 ENCOUNTER — Encounter: Payer: Self-pay | Admitting: Family

## 2019-07-24 ENCOUNTER — Other Ambulatory Visit: Payer: Self-pay

## 2019-07-24 ENCOUNTER — Ambulatory Visit: Payer: BC Managed Care – PPO | Admitting: Family

## 2019-07-24 VITALS — BP 112/80 | HR 111 | Temp 98.9°F | Ht 65.0 in | Wt 177.0 lb

## 2019-07-24 DIAGNOSIS — F419 Anxiety disorder, unspecified: Secondary | ICD-10-CM | POA: Diagnosis not present

## 2019-07-24 DIAGNOSIS — F329 Major depressive disorder, single episode, unspecified: Secondary | ICD-10-CM | POA: Diagnosis not present

## 2019-07-24 MED ORDER — ESCITALOPRAM OXALATE 10 MG PO TABS: 10.0000 mg | ORAL_TABLET | Freq: Every day | ORAL | 0 refills | Status: DC

## 2019-07-24 NOTE — Progress Notes (Signed)
Kelly Walsh is a 39 y.o. female with the following history as recorded in EpicCare:  Patient Active Problem List   Diagnosis Date Noted  . Migraine headache 08/25/2018  . Fibroids 08/25/2018  . Allergic rhinitis 08/25/2018  . Complex regional pain syndrome of lower limb 10/07/2017    Current Outpatient Medications  Medication Sig Dispense Refill  . fluticasone (FLONASE) 50 MCG/ACT nasal spray Place 2 sprays into the nose daily.    Marland Kitchen loratadine (CLARITIN) 10 MG tablet Take 10 mg by mouth daily.    . naproxen sodium (ALEVE) 220 MG tablet Take 220 mg by mouth as needed.    . nortriptyline (PAMELOR) 10 MG capsule Take 1 capsule (10 mg total) by mouth at bedtime. 30 capsule 3  . Probiotic Product (PROBIOTIC DAILY PO) Take by mouth.    . SUMAtriptan (IMITREX) 100 MG tablet Take 1 tablet earliest onset of migraine.  May repeat in 2 hours if headache persists or recurs.  Maximum 2 tablets in 24 hours 10 tablet 3  . diclofenac Sodium (VOLTAREN) 1 % GEL Apply 4 g topically 4 (four) times daily. To affected joint. (Patient not taking: Reported on 06/30/2019) 100 g 11  . escitalopram (LEXAPRO) 10 MG tablet Take 1 tablet (10 mg total) by mouth daily. 90 tablet 0   No current facility-administered medications for this visit.    Allergies: Bupropion, Metformin, Oxycodone-acetaminophen, Tramadol, Codeine, and Oxycodone  Past Medical History:  Diagnosis Date  . Anemia    H/O  . Hx of migraines   . IBS (irritable bowel syndrome)     Past Surgical History:  Procedure Laterality Date  . FOOT SURGERY    . WISDOM TOOTH EXTRACTION      Family History  Problem Relation Age of Onset  . Diabetes Father   . Hypertension Mother   . Lupus Cousin     Social History   Tobacco Use  . Smoking status: Never Smoker  . Smokeless tobacco: Never Used  Substance Use Topics  . Alcohol use: No    Subjective:  Requesting trial of SSRI based on previous conversation with her counselor; is requesting a  referral to different therapist as well- prefers to do virtual counseling visits; Notes she is having increased problems with anxiety/ personal stress and is concerned with how this is affecting her ability to perform her job/ take care of her personal needs; had reaction to Wellbutrin in the past;     Objective:  Vitals:   07/24/19 1135  BP: 112/80  Pulse: (!) 111  Temp: 98.9 F (37.2 C)  TempSrc: Oral  SpO2: 98%  Weight: 177 lb (80.3 kg)  Height: 5\' 5"  (1.651 m)    General: Well developed, well nourished, in no acute distress  Skin : Warm and dry.  Head: Normocephalic and atraumatic  Lungs: Respirations unlabored; clear to auscultation bilaterally without wheeze, rales, rhonchi  Neurologic: Alert and oriented; speech intact; face symmetrical; moves all extremities well; CNII-XII intact without focal deficit   Assessment:  1. Anxiety and depression     Plan:  Trial of Lexapro 10 mg daily- risks and benefits discussed; will refer to counselor through [facility]; follow-up in 1 month, sooner prn.  Return in about 1 month (around 08/24/2019) for virtual visit.  Orders Placed This Encounter  Procedures  . Ambulatory referral to Psychology    Referral Priority:   Routine    Referral Type:   Psychiatric    Referral Reason:   Specialty  Services Required    Requested Specialty:   Psychology    Number of Visits Requested:   1    Requested Prescriptions   Signed Prescriptions Disp Refills  . escitalopram (LEXAPRO) 10 MG tablet 90 tablet 0    Sig: Take 1 tablet (10 mg total) by mouth daily.

## 2019-08-19 ENCOUNTER — Ambulatory Visit (INDEPENDENT_AMBULATORY_CARE_PROVIDER_SITE_OTHER): Payer: BC Managed Care – PPO | Admitting: Psychology

## 2019-08-19 ENCOUNTER — Ambulatory Visit: Payer: BC Managed Care – PPO | Admitting: Psychology

## 2019-08-19 DIAGNOSIS — F4310 Post-traumatic stress disorder, unspecified: Secondary | ICD-10-CM | POA: Diagnosis not present

## 2019-08-19 DIAGNOSIS — F332 Major depressive disorder, recurrent severe without psychotic features: Secondary | ICD-10-CM | POA: Diagnosis not present

## 2019-08-24 ENCOUNTER — Ambulatory Visit
Admission: EM | Admit: 2019-08-24 | Discharge: 2019-08-24 | Disposition: A | Discharge status: Home / Self Care | Payer: BC Managed Care – PPO | Attending: Emergency Medicine | Admitting: Emergency Medicine

## 2019-08-24 ENCOUNTER — Other Ambulatory Visit: Payer: Self-pay

## 2019-08-24 ENCOUNTER — Encounter: Payer: Self-pay | Admitting: Emergency Medicine

## 2019-08-24 DIAGNOSIS — M79644 Pain in right finger(s): Secondary | ICD-10-CM | POA: Diagnosis not present

## 2019-08-24 DIAGNOSIS — R000 Tachycardia, unspecified: Secondary | ICD-10-CM | POA: Diagnosis not present

## 2019-08-24 MED ORDER — FLUCONAZOLE 200 MG PO TABS: 200.0000 mg | ORAL_TABLET | Freq: Once | ORAL | 0 refills | Status: AC

## 2019-08-24 MED ORDER — DOXYCYCLINE HYCLATE 100 MG PO CAPS: 100.0000 mg | ORAL_CAPSULE | Freq: Two times a day (BID) | ORAL | 0 refills | Status: AC

## 2019-08-24 NOTE — Discharge Instructions (Addendum)
Recommend RICE: rest, ice, compression, elevation as needed for pain.    Heat therapy (hot compress, warm wash red, hot showers, etc.) can help relax muscles and soothe muscle aches. Cold therapy (ice packs) can be used to help swelling both after injury and after prolonged use of areas of chronic pain/aches.  For pain: naproxen (440mg  2 times a day), may use tylenol as well.  Return for any pain, swelling, numbness.

## 2019-08-24 NOTE — ED Provider Notes (Signed)
[name]    CSN: [id number] Arrival date & time: 08/24/19  1916      History   Chief Complaint Chief Complaint  Patient presents with  . Hand Pain    HPI Kelly Walsh is a 39 y.o. female presenting for 2-week course of swelling and pain to right pinky.  Patient states symptoms have progressively gotten worse: Over the last few days she has had more tenderness, decreased ROM, warmth.  Patient denies similar symptoms in the past.  Denies known autoimmune disorders, arthritis, immunocompromise state, gout.  Denies injury, bug bite.  Has tried Aleve daily and topical diclofenac with relief of pain, though nothing is help with swelling.  No fever, generalized arthralgias, rash, cough, chest pain, shortness of breath.   Past Medical History:  Diagnosis Date  . Anemia    H/O  . Hx of migraines   . IBS (irritable bowel syndrome)     Patient Active Problem List   Diagnosis Date Noted  . Migraine headache 08/25/2018  . Fibroids 08/25/2018  . Allergic rhinitis 08/25/2018  . Complex regional pain syndrome of lower limb 10/07/2017    Past Surgical History:  Procedure Laterality Date  . FOOT SURGERY    . WISDOM TOOTH EXTRACTION      OB History    Gravida  0   Para  0   Term  0   Preterm  0   AB  0   Living  0     SAB  0   TAB  0   Ectopic  0   Multiple  0   Live Births               Home Medications    Prior to Admission medications   Medication Sig Start Date End Date Taking? Authorizing Provider  diclofenac Sodium (VOLTAREN) 1 % GEL Apply 4 g topically 4 (four) times daily. To affected joint. Patient not taking: Reported on 06/30/2019 05/15/19   Gregor Hams, MD  doxycycline (VIBRAMYCIN) 100 MG capsule Take 1 capsule (100 mg total) by mouth 2 (two) times daily for 5 days. 08/24/19 08/29/19  Hall-Potvin, [place], PA-C  escitalopram (LEXAPRO) 10 MG tablet Take 1 tablet (10 mg total) by mouth daily. 07/24/19   Marrian Salvage,  FNP  fluconazole (DIFLUCAN) 200 MG tablet Take 1 tablet (200 mg total) by mouth once for 1 dose. May repeat in 72 hours if needed 08/24/19 08/24/19  Hall-Potvin, [place], PA-C  fluticasone (FLONASE) 50 MCG/ACT nasal spray Place 2 sprays into the nose daily.    [provider]  loratadine (CLARITIN) 10 MG tablet Take 10 mg by mouth daily.    [provider]  naproxen sodium (ALEVE) 220 MG tablet Take 220 mg by mouth as needed.    [provider]  nortriptyline (PAMELOR) 10 MG capsule Take 1 capsule (10 mg total) by mouth at bedtime. 12/24/18   Pieter Partridge, DO  Probiotic Product (PROBIOTIC DAILY PO) Take by mouth.    [provider]  SUMAtriptan (IMITREX) 100 MG tablet Take 1 tablet earliest onset of migraine.  May repeat in 2 hours if headache persists or recurs.  Maximum 2 tablets in 24 hours 12/24/18   Pieter Partridge, DO    Family History Family History  Problem Relation Age of Onset  . Diabetes Father   . Hypertension Mother   . Lupus Cousin     Social History Social History   Tobacco Use  .  Smoking status: Never Smoker  . Smokeless tobacco: Never Used  Substance Use Topics  . Alcohol use: No  . Drug use: No     Allergies   Bupropion, Metformin, Oxycodone-acetaminophen, Tramadol, Codeine, and Oxycodone   Review of Systems As per HPI   Physical Exam Triage Vital Signs ED Triage Vitals  Enc Vitals Group     BP      Pulse      Resp      Temp      Temp src      SpO2      Weight      Height      Head Circumference      Peak Flow      Pain Score      Pain Loc      Pain Edu?      Excl. in [place]?    No data found.  Updated Vital Signs BP 125/85 (BP Location: Left Arm)   Pulse (!) 115   Temp 97.7 F (36.5 C) (Temporal)   Resp 18   LMP 08/11/2019   SpO2 97%   Visual Acuity Right Eye Distance:   Left Eye Distance:   Bilateral Distance:    Right Eye Near:   Left Eye Near:    Bilateral Near:     Physical Exam  Constitutional:      General: She is not in acute distress. HENT:     Head: Normocephalic and atraumatic.  Eyes:     General: No scleral icterus.    Pupils: Pupils are equal, round, and reactive to light.  Cardiovascular:     Rate and Rhythm: Normal rate.  Pulmonary:     Effort: Pulmonary effort is normal.  Musculoskeletal:        General: Swelling and tenderness present. Normal range of motion.     Comments: Right fifth PIP with medial tenderness, swelling seems to spare bony joint (more superficial).  Overlying erythema, TTP.  No tophi, fluctuance or induration appreciated.  No crepitus, painful ROM.  Sensation and strength are intact.  Skin:    Capillary Refill: Capillary refill takes less than 2 seconds.     Coloration: Skin is not jaundiced or pale.  Neurological:     Mental Status: She is alert and oriented to person, place, and time.      UC Treatments / Results  Labs (all labs ordered are listed, but only abnormal results are displayed) Labs Reviewed - No data to display  EKG   Radiology No results found.  Procedures Procedures (including critical care time)  Medications Ordered in UC Medications - No data to display  Initial Impression / Assessment and Plan / UC Course  I have reviewed the triage vital signs and the nursing notes.  Pertinent labs & imaging results that were available during my care of the patient were reviewed by me and considered in my medical decision making (see chart for details).     Patient afebrile, nontoxic in office.  Patient is tachycardic, though asymptomatic.  Endorsing chronic history of tachycardia with unremarkable work-up.  H&P concerning for possible cellulitis versus arthritis versus gout versus cyst.  Low concern for involvement within joint spaces patient has painless ROM.  Will trial NSAIDs, short-term antibiotics, and have patient keep follow-up with PCP on Friday for repeat evaluation.  Return precautions discussed,  patient verbalized understanding and is agreeable to plan. Final Clinical Impressions(s) / UC Diagnoses   Final diagnoses:  Finger pain, right  Discharge Instructions     Recommend RICE: rest, ice, compression, elevation as needed for pain.    Heat therapy (hot compress, warm wash red, hot showers, etc.) can help relax muscles and soothe muscle aches. Cold therapy (ice packs) can be used to help swelling both after injury and after prolonged use of areas of chronic pain/aches.  For pain: naproxen (440mg  2 times a day), may use tylenol as well.  Return for any pain, swelling, numbness.    ED Prescriptions    Medication Sig Dispense Auth. Provider   doxycycline (VIBRAMYCIN) 100 MG capsule Take 1 capsule (100 mg total) by mouth 2 (two) times daily for 5 days. 10 capsule Hall-Potvin, [place], PA-C   fluconazole (DIFLUCAN) 200 MG tablet Take 1 tablet (200 mg total) by mouth once for 1 dose. May repeat in 72 hours if needed 2 tablet Hall-Potvin, [place], PA-C     I have reviewed the PDMP during this encounter.   Neldon Mc [place], [place] 08/24/19 1950

## 2019-08-24 NOTE — ED Triage Notes (Signed)
Pt presents to [facility] for assessment of 2 weeks of swelling to the pinky of her right hand.  States for the first week it was just swollen and had decreased ROm, and states starting this weekend she began having more pain and an increasing bump to the 2nd joint of the finger nail.  Patient states today she noted more swelling, and more sensitivity to touch.

## 2019-08-27 ENCOUNTER — Ambulatory Visit (INDEPENDENT_AMBULATORY_CARE_PROVIDER_SITE_OTHER): Payer: BC Managed Care – PPO | Admitting: Psychology

## 2019-08-27 DIAGNOSIS — F411 Generalized anxiety disorder: Secondary | ICD-10-CM

## 2019-08-27 DIAGNOSIS — F332 Major depressive disorder, recurrent severe without psychotic features: Secondary | ICD-10-CM | POA: Diagnosis not present

## 2019-08-28 ENCOUNTER — Ambulatory Visit (INDEPENDENT_AMBULATORY_CARE_PROVIDER_SITE_OTHER): Payer: BC Managed Care – PPO | Admitting: Family

## 2019-08-28 DIAGNOSIS — F419 Anxiety disorder, unspecified: Secondary | ICD-10-CM | POA: Diagnosis not present

## 2019-08-28 DIAGNOSIS — F329 Major depressive disorder, single episode, unspecified: Secondary | ICD-10-CM

## 2019-08-28 DIAGNOSIS — M791 Myalgia, unspecified site: Secondary | ICD-10-CM

## 2019-08-28 DIAGNOSIS — M79646 Pain in unspecified finger(s): Secondary | ICD-10-CM | POA: Diagnosis not present

## 2019-08-28 DIAGNOSIS — R5383 Other fatigue: Secondary | ICD-10-CM | POA: Diagnosis not present

## 2019-08-28 NOTE — Progress Notes (Signed)
Kelly Walsh is a 38 y.o. female with the following history as recorded in EpicCare:  Patient Active Problem List   Diagnosis Date Noted  . Migraine headache 08/25/2018  . Fibroids 08/25/2018  . Allergic rhinitis 08/25/2018  . Complex regional pain syndrome of lower limb 10/07/2017    Current Outpatient Medications  Medication Sig Dispense Refill  . diclofenac Sodium (VOLTAREN) 1 % GEL Apply 4 g topically 4 (four) times daily. To affected joint. (Patient not taking: Reported on 06/30/2019) 100 g 11  . doxycycline (VIBRAMYCIN) 100 MG capsule Take 1 capsule (100 mg total) by mouth 2 (two) times daily for 5 days. 10 capsule 0  . escitalopram (LEXAPRO) 10 MG tablet Take 1 tablet (10 mg total) by mouth daily. 90 tablet 0  . fluticasone (FLONASE) 50 MCG/ACT nasal spray Place 2 sprays into the nose daily.    Marland Kitchen loratadine (CLARITIN) 10 MG tablet Take 10 mg by mouth daily.    . naproxen sodium (ALEVE) 220 MG tablet Take 220 mg by mouth as needed.    . nortriptyline (PAMELOR) 10 MG capsule Take 1 capsule (10 mg total) by mouth at bedtime. 30 capsule 3  . Probiotic Product (PROBIOTIC DAILY PO) Take by mouth.    . SUMAtriptan (IMITREX) 100 MG tablet Take 1 tablet earliest onset of migraine.  May repeat in 2 hours if headache persists or recurs.  Maximum 2 tablets in 24 hours 10 tablet 3   No current facility-administered medications for this visit.    Allergies: Bupropion, Metformin, Oxycodone-acetaminophen, Tramadol, Codeine, and Oxycodone  Past Medical History:  Diagnosis Date  . Anemia    H/O  . Hx of migraines   . IBS (irritable bowel syndrome)     Past Surgical History:  Procedure Laterality Date  . FOOT SURGERY    . WISDOM TOOTH EXTRACTION      Family History  Problem Relation Age of Onset  . Diabetes Father   . Hypertension Mother   . Lupus Cousin     Social History   Tobacco Use  . Smoking status: Never Smoker  . Smokeless tobacco: Never Used  Substance Use Topics  .  Alcohol use: No    Subjective:   I connected with Kelly Walsh on 08/28/19 at  3:40 PM EST by a video enabled telemedicine application and verified that I am speaking with the correct person using two identifiers.   I discussed the limitations of evaluation and management by telemedicine and the availability of in person appointments. The patient expressed understanding and agreed to proceed. Provider in office/ patient is at home; provider and patient are only 2 people on video call.   1 month follow up on recent start of Lexapro- notes she has not seen any benefit; admits that work schedule is still very complicated; has been able to establish with a new therapist- feels this is a better fit for her;  Was seen at [place] earlier this week with finger pain; being treated for possible infection but is concerned about "lump" on the finger; notes that area is very painful;     Objective:  There were no vitals filed for this visit.  General: Well developed, well nourished, in no acute distress  Head: Normocephalic and atraumatic  Lungs: Respirations unlabored;  Neurologic: Alert and oriented; speech intact; face symmetrical; moves all extremities well; CNII-XII intact without focal deficit   Assessment:  1. Anxiety and depression   2. Other fatigue   3. Pain of  finger, unspecified laterality   4. Myalgia     Plan:  1. Limited response to Lexapro at this point- will try increasing to 20 mg and she will let me know her response in 2 weeks; if no improvement, to consider changing to Effexor or Cymbalta; follow up to be determined; will have her come get labs updated as well; 3. Plan to follow up with sports medicine- limited visualization over virtual visit but suspect she will need ultrasound imaging; ? Cystic structure;   No follow-ups on file.  Orders Placed This Encounter  Procedures  . CBC w/Diff    Standing Status:   Future    Standing Expiration Date:   08/27/2020  . Comp Met  (CMET)    Standing Status:   Future    Standing Expiration Date:   08/27/2020  . TSH    Standing Status:   Future    Standing Expiration Date:   08/27/2020  . B12    Standing Status:   Future    Standing Expiration Date:   08/27/2020  . Uric acid    Standing Status:   Future    Standing Expiration Date:   08/27/2020  . Antinuclear Antib (ANA)    Standing Status:   Future    Standing Expiration Date:   08/27/2020  . Rheumatoid Factor    Standing Status:   Future    Standing Expiration Date:   08/27/2020  . Sed Rate (ESR)    Standing Status:   Future    Standing Expiration Date:   08/27/2020    Requested Prescriptions    No prescriptions requested or ordered in this encounter

## 2019-09-01 ENCOUNTER — Ambulatory Visit: Payer: Self-pay

## 2019-09-01 ENCOUNTER — Other Ambulatory Visit: Payer: Self-pay

## 2019-09-01 ENCOUNTER — Ambulatory Visit: Payer: BC Managed Care – PPO | Admitting: Family Medicine

## 2019-09-01 ENCOUNTER — Encounter: Payer: Self-pay | Admitting: Family Medicine

## 2019-09-01 VITALS — BP 120/78 | HR 91 | Ht 65.0 in | Wt 178.4 lb

## 2019-09-01 DIAGNOSIS — M25511 Pain in right shoulder: Secondary | ICD-10-CM

## 2019-09-01 DIAGNOSIS — R5383 Other fatigue: Secondary | ICD-10-CM

## 2019-09-01 DIAGNOSIS — M791 Myalgia, unspecified site: Secondary | ICD-10-CM

## 2019-09-01 MED ORDER — COLCHICINE 0.6 MG PO TABS: 0.6000 mg | ORAL_TABLET | Freq: Every day | ORAL | 2 refills | Status: DC

## 2019-09-01 NOTE — Progress Notes (Signed)
I, Wendy Poet, LAT, ATC, am serving as scribe for Dr. Lynne Leader.  Kelly Walsh is a 39 y.o. female who presents to [place] at [facility] today for R pinky finger pain and swelling x 2-3 weeks.  She was seen at the [facility] on 08/24/19 for these symptoms when the pain and swelling became unbearable and treated w/ doxycycline.  Patient was also given fluconazole..  Pt states that the antibiotics helped w/ the pain and inflammation but the actual "knot" at her R lateral PIP joint is still present and still hurts.    She states that she still doesn't full ROM in her R pinky finger.   Pertinent review of systems: No fevers or chills.  No night sweats or weight loss.  Relevant historical information: History complex regional pain syndrome leg.   Exam:  BP 120/78 (BP Location: Left Arm, Patient Position: Sitting, Cuff Size: Normal)   Pulse 91   Ht 5\' 5"  (1.651 m)   Wt 178 lb 6.4 oz (80.9 kg)   LMP 08/11/2019   SpO2 97%   BMI 29.69 kg/m  General: Well Developed, well nourished, and in no acute distress.   MSK: Right fifth digit slight swelling at the radial aspect of the PIP joint.  Mildly tender in this region.  No surrounding erythema or induration.  Slight decreased flexion of PIP.  Extension of PIP and full motion of other finger joints are intact. Pulses cap refill sensation are intact. Strength is intact to flexion and extension. No laxity to ulnar or radial stress..    Lab and Radiology Results  Diagnostic Limited MSK Ultrasound of: Right fifth PIP Hypoechoic fluid emerging from joint capsule consistent with joint effusion present at radial aspect of joint.  No visible joint effusion present dorsal volar or ulnar side of joint.  Joint bony structures are intact. Radial collateral ligament not directly visible. Impression: Joint effusion versus slightly deformed ganglion cyst right fifth PIP hand  X-ray right fifth digit ordered but not yet  completed   Assessment and Plan: 39 y.o. female with right hand fifth digit swelling at PIP joint.  Patient was originally seen in urgent care on February 22 and treated with doxycycline fluconazole for presumed infection.  The surrounding swelling pain and erythema improved significantly with a course of doxycycline.  She still has a little bit of pain and tenderness.  Bacterial infection is a possibility however she denies any puncture wound of this area.  Differential also occludes gout or rheumatologic process. Plan for x-ray as above which is negative and completed.  Additionally complete course of doxycycline which should end today.  If pain starts to return following completion of doxycycline she will notify me and I will extend and broaden antibiotic course for presumed septic joint effusion. Of note the amount of fluid in the joint is so tiny that I do not think aspiration will be able to yield sufficient fluid to culture.  Additionally patient is already on antibiotics making culture more likely to result in a false negative. We will additionally treat for presumed gout with colchicine.  Labs have been arranged for by PCP including rheumatologic labs also including uric acid.  Plan to recheck in 2 weeks.  If all is well we will reconfirm joint effusion has resolved.  If worsening will progress treatment.  Uncertain diagnosis    Orders Placed This Encounter  Procedures  . [place] LIMITED JOINT SPACE STRUCTURES UP RIGHT(NO LINKED CHARGES)  Order Specific Question:   Reason for Exam (SYMPTOM  OR DIAGNOSIS REQUIRED)    Answer:   rt 5th pip    Order Specific Question:   Preferred imaging location?    Answer:   [place]  . DG Finger [place] Right    Standing Status:   Future    Standing Expiration Date:   10/31/2020    Order Specific Question:   Reason for Exam (SYMPTOM  OR DIAGNOSIS REQUIRED)    Answer:   eval effusion 5th pip    Order Specific Question:   Is  patient pregnant?    Answer:   No    Order Specific Question:   Preferred imaging location?    Answer:   [facility]    Order Specific Question:   Radiology Contrast Protocol - do NOT remove file path    Answer:   [email or web address]   Meds ordered this encounter  Medications  . colchicine 0.6 MG tablet    Sig: Take 1 tablet (0.6 mg total) by mouth daily.    Dispense:  30 tablet    Refill:  2     Discussed warning signs or symptoms. Please see discharge instructions. Patient expresses understanding.   The above documentation has been reviewed and is accurate and complete Lynne Leader

## 2019-09-01 NOTE — Patient Instructions (Addendum)
Thank you for coming in today. Get labs that Jodi Mourning ordered.  Schedule xray soon  Finish doxycycline.  If pain starts returning let me know ASAP.  I will extend and broaden antibiotics.  I will add colchicine which is helpful for gout and psuedogout.  Recheck with me in 2-4 weeks.  If not better could consider injection or occupation therapy or even MRI.

## 2019-09-02 LAB — COMPREHENSIVE METABOLIC PANEL
ALT: 12 U/L (ref 0–35)
AST: 14 U/L (ref 0–37)
Albumin: 4 g/dL (ref 3.5–5.2)
Alkaline Phosphatase: 51 U/L (ref 39–117)
BUN: 9 mg/dL (ref 6–23)
CO2: 29 mEq/L (ref 19–32)
Calcium: 10.4 mg/dL (ref 8.4–10.5)
Chloride: 105 mEq/L (ref 96–112)
Creatinine, Ser: 0.87 mg/dL (ref 0.40–1.20)
GFR: 87.05 mL/min (ref >60.00)
Glucose, Bld: 87 mg/dL (ref 70–99)
Potassium: 4.1 mEq/L (ref 3.5–5.1)
Sodium: 139 mEq/L (ref 135–145)
Total Bilirubin: 0.7 mg/dL (ref 0.2–1.2)
Total Protein: 7.1 g/dL (ref 6.0–8.3)

## 2019-09-02 LAB — CBC WITH DIFFERENTIAL/PLATELET
Basophils Absolute: 0.1 10*3/uL (ref 0.0–0.1)
Basophils Relative: 1 % (ref 0.0–3.0)
Eosinophils Absolute: 0.1 10*3/uL (ref 0.0–0.7)
Eosinophils Relative: 1.1 % (ref 0.0–5.0)
HCT: 40.9 % (ref 36.0–46.0)
Hemoglobin: 13.6 g/dL (ref 12.0–15.0)
Lymphocytes Relative: 47.2 % — ABNORMAL HIGH (ref 12.0–46.0)
Lymphs Abs: 2.4 10*3/uL (ref 0.7–4.0)
MCHC: 33.4 g/dL (ref 30.0–36.0)
MCV: 94.5 fl (ref 78.0–100.0)
Monocytes Absolute: 0.4 10*3/uL (ref 0.1–1.0)
Monocytes Relative: 7.4 % (ref 3.0–12.0)
Neutro Abs: 2.2 10*3/uL (ref 1.4–7.7)
Neutrophils Relative %: 43.3 % (ref 43.0–77.0)
Platelets: 202 10*3/uL (ref 150.0–400.0)
RBC: 4.33 Mil/uL (ref 3.87–5.11)
RDW: 11.9 % (ref 11.5–15.5)
WBC: 5.2 10*3/uL (ref 4.0–10.5)

## 2019-09-02 LAB — URIC ACID: Uric Acid, Serum: 3 mg/dL (ref 2.4–7.0)

## 2019-09-02 LAB — VITAMIN B12: Vitamin B-12: 632 pg/mL (ref 211–911)

## 2019-09-02 LAB — SEDIMENTATION RATE: Sed Rate: 4 mm/hr (ref 0–20)

## 2019-09-02 LAB — TSH: TSH: 2.38 u[IU]/mL (ref 0.35–4.50)

## 2019-09-03 ENCOUNTER — Ambulatory Visit (INDEPENDENT_AMBULATORY_CARE_PROVIDER_SITE_OTHER): Payer: BC Managed Care – PPO | Admitting: Psychology

## 2019-09-03 DIAGNOSIS — F332 Major depressive disorder, recurrent severe without psychotic features: Secondary | ICD-10-CM

## 2019-09-03 DIAGNOSIS — F411 Generalized anxiety disorder: Secondary | ICD-10-CM

## 2019-09-03 LAB — ANA: Anti Nuclear Antibody (ANA): POSITIVE — AB

## 2019-09-03 LAB — ANTI-NUCLEAR AB-TITER (ANA TITER): ANA Titer 1: 1:320 {titer} — ABNORMAL HIGH

## 2019-09-03 LAB — RHEUMATOID FACTOR: Rheumatoid fact SerPl-aCnc: <14 IU/mL (ref <14)

## 2019-09-04 ENCOUNTER — Other Ambulatory Visit: Payer: Self-pay

## 2019-09-04 ENCOUNTER — Other Ambulatory Visit: Payer: Self-pay | Admitting: Family

## 2019-09-04 ENCOUNTER — Encounter: Payer: Self-pay | Admitting: Family Medicine

## 2019-09-04 ENCOUNTER — Ambulatory Visit (INDEPENDENT_AMBULATORY_CARE_PROVIDER_SITE_OTHER)
Admission: RE | Admit: 2019-09-04 | Discharge: 2019-09-04 | Disposition: A | Discharge status: Home / Self Care | Payer: BC Managed Care – PPO | Source: Ambulatory Visit | Attending: Family Medicine | Admitting: Family Medicine

## 2019-09-04 DIAGNOSIS — R768 Other specified abnormal immunological findings in serum: Secondary | ICD-10-CM

## 2019-09-04 DIAGNOSIS — M25511 Pain in right shoulder: Secondary | ICD-10-CM | POA: Diagnosis not present

## 2019-09-04 DIAGNOSIS — M791 Myalgia, unspecified site: Secondary | ICD-10-CM

## 2019-09-04 DIAGNOSIS — M79644 Pain in right finger(s): Secondary | ICD-10-CM | POA: Diagnosis not present

## 2019-09-07 NOTE — Progress Notes (Signed)
Radiology see some calcifications at the swollen joint on your pinky finger.  It is possible that you have injured the ligament there.

## 2019-09-09 NOTE — Progress Notes (Deleted)
NEUROLOGY FOLLOW UP OFFICE NOTE  Kelly Walsh [id number]  HISTORY OF PRESENT ILLNESS: Kelly Walsh is a 39 year old woman who follows up for migraine.  UPDATE: Intensity:  *** Duration:  *** Frequency:  ***  Current NSAIDS:  naproxen Current analgesics:  none Current triptans:  Sumatriptan 100mg   Current ergotamine:  none Current anti-emetic:  none Current muscle relaxants:  none Current anti-anxiolytic:  none Current sleep aide:  none Current Antihypertensive medications:  none Current Antidepressant medications:  Nortriptyline 10mg  at bedtime Current Anticonvulsant medications:  none Current anti-CGRP:  none Current Vitamins/Herbal/Supplements:  none Current Antihistamines/Decongestants:  Claritin, Flonase Other therapy:  none  Caffeine:1/2 cup of coffee but not daily. Rarely has a sip of 5 hour energy drink Diet:Hydrates with water. Exercise:Not routine but just started to exercise. Depression:some; Anxiety:yes Other pain:Back pain, right shoulder pain Sleep hygiene:Varies. History of chronic fatigue. She was supposed to have a sleep study but was never able to keep the appointment.  HISTORY: Onset: Since her early 51s. They were fairly well controlled and responded to Aleve until gradually getting worse in 2018 in which they have become more frequent and not responding to medication. Location:bifrontal Quality:sharp Initial intensity:Severe. Shedenies new headache, thunderclap headache Aura:no Premonitory Phase:no Postdrome:no Associated symptoms: Photophobia, phonophobia, osmophobia.Shedenies associated nausea, vomiting, visual disturbance, orunilateral numbness or weakness. Initial duration:Quick onset. Usually 3 to 4 days Initial Frequency:At least once a month Triggers: Unknown Relieving factors: Air conditioning, ice pack, rest in quiet dark room Activity:Cannot function for 3 days  MRI of brain  with and without contrast from 01/27/14 was normal. MRI of brain with and without contrast from 09/07/18 personally reviewed and was normal.   Past NSAIDS:ibuprofen Past analgesics:Excedrin Migraine, Tylenol Past abortive triptans:Maxalt Past abortive ergotamine:none Past muscle relaxants:none Past anti-emetic:Zofran 4mg  Past antihypertensive medications:none Past antidepressant medications:none Past anticonvulsant medications:topiramate 25mg  twice daily(stopped due to UTI, cloudy urine and ineffective) Past anti-CGRP:none Past vitamins/Herbal/Supplements:none Past antihistamines/decongestants:none Other past therapies:none   Family history of headache:No  PAST MEDICAL HISTORY: Past Medical History:  Diagnosis Date  . Anemia    H/O  . Hx of migraines   . IBS (irritable bowel syndrome)     MEDICATIONS: Current Outpatient Medications on File Prior to Visit  Medication Sig Dispense Refill  . colchicine 0.6 MG tablet Take 1 tablet (0.6 mg total) by mouth daily. 30 tablet 2  . diclofenac Sodium (VOLTAREN) 1 % GEL Apply 4 g topically 4 (four) times daily. To affected joint. (Patient not taking: Reported on 06/30/2019) 100 g 11  . escitalopram (LEXAPRO) 10 MG tablet Take 1 tablet (10 mg total) by mouth daily. 90 tablet 0  . fluticasone (FLONASE) 50 MCG/ACT nasal spray Place 2 sprays into the nose daily.    Marland Kitchen loratadine (CLARITIN) 10 MG tablet Take 10 mg by mouth daily.    . naproxen sodium (ALEVE) 220 MG tablet Take 220 mg by mouth as needed.    . nortriptyline (PAMELOR) 10 MG capsule Take 1 capsule (10 mg total) by mouth at bedtime. (Patient not taking: Reported on 09/01/2019) 30 capsule 3  . Probiotic Product (PROBIOTIC DAILY PO) Take by mouth.    . SUMAtriptan (IMITREX) 100 MG tablet Take 1 tablet earliest onset of migraine.  May repeat in 2 hours if headache persists or recurs.  Maximum 2 tablets in 24 hours (Patient not taking: Reported on 09/01/2019)  10 tablet 3   No current facility-administered medications on file prior to visit.    ALLERGIES: Allergies  Allergen Reactions  . Bupropion Hives  . Metformin Nausea Only  . Oxycodone-Acetaminophen Itching  . Tramadol Itching  . Codeine   . Oxycodone     FAMILY HISTORY: Family History  Problem Relation Age of Onset  . Diabetes Father   . Hypertension Mother   . Lupus Cousin    SOCIAL HISTORY: Social History   Socioeconomic History  . Marital status: Single    Spouse name: Not on file  . Number of children: 0  . Years of education: Not on file  . Highest education level: Some college, no degree  Occupational History  . Not on file  Tobacco Use  . Smoking status: Never Smoker  . Smokeless tobacco: Never Used  Substance and Sexual Activity  . Alcohol use: No  . Drug use: No  . Sexual activity: Yes    Birth control/protection: Condom  Other Topics Concern  . Not on file  Social History Narrative   Right handed, lives in an apartment on 2nd floor alone.   Pt drinks coffee, occasionally hot tea, soda sometimes, water   No children   Does not exercises regularly    Social Determinants of Health   Financial Resource Strain:   . Difficulty of Paying Living Expenses: Not on file  Food Insecurity:   . Worried About Charity fundraiser in the Last Year: Not on file  . Ran Out of Food in the Last Year: Not on file  Transportation Needs:   . Lack of Transportation (Medical): Not on file  . Lack of Transportation (Non-Medical): Not on file  Physical Activity:   . Days of Exercise per Week: Not on file  . Minutes of Exercise per Session: Not on file  Stress:   . Feeling of Stress : Not on file  Social Connections:   . Frequency of Communication with Friends and Family: Not on file  . Frequency of Social Gatherings with Friends and Family: Not on file  . Attends Religious Services: Not on file  . Active Member of Clubs or Organizations: Not on file  . Attends English as a second language teacher Meetings: Not on file  . Marital Status: Not on file  Intimate Partner Violence:   . Fear of Current or Ex-Partner: Not on file  . Emotionally Abused: Not on file  . Physically Abused: Not on file  . Sexually Abused: Not on file    PHYSICAL EXAM: *** General: No acute distress.  Patient appears well-groomed.   Head:  Normocephalic/atraumatic Eyes:  Fundi examined but not visualized Neck: supple, no paraspinal tenderness, full range of motion Heart:  Regular rate and rhythm Lungs:  Clear to auscultation bilaterally Back: No paraspinal tenderness Neurological Exam: alert and oriented to person, place, and time. Attention span and concentration intact, recent and remote memory intact, fund of knowledge intact.  Speech fluent and not dysarthric, language intact.  CN II-XII intact. Bulk and tone normal, muscle strength 5/5 throughout.  Sensation to light touch, temperature and vibration intact.  Deep tendon reflexes 2+ throughout, toes downgoing.  Finger to nose and heel to shin testing intact.  Gait normal, Romberg negative.  IMPRESSION: ***  PLAN: 1.  For preventative management, *** 2.  For abortive therapy, *** 3.  Limit use of pain relievers to no more than 2 days out of week to prevent risk of rebound or medication-overuse headache. 4.  Keep headache diary 5.  Exercise, hydration, caffeine cessation, sleep hygiene, monitor for and avoid triggers 6. Follow up ***  Metta Clines, DO  CC: Marrian Salvage, [place]

## 2019-09-10 ENCOUNTER — Ambulatory Visit: Payer: BC Managed Care – PPO | Admitting: Neurology

## 2019-09-10 ENCOUNTER — Telehealth: Payer: Self-pay | Admitting: Family Medicine

## 2019-09-10 NOTE — Telephone Encounter (Signed)
Returned pt's call.  She will keep her appt on 09/15/19 unless she gets in w/ Rheumatology near that date and then she will cancel.  I provided the pt w/ [facility]'s number.

## 2019-09-10 NOTE — Telephone Encounter (Signed)
Spoke with patient. The swelling is gone, much less painful. Only original "knot" left on her hand.  Would injection help that?

## 2019-09-10 NOTE — Telephone Encounter (Signed)
Pt wants to be sure she needs to come for her recheck 3/16 or is she supposed to wait until after her visit with Rheumatology? I advised her referrals to Rheumatology take a long time.

## 2019-09-10 NOTE — Telephone Encounter (Signed)
Is still having problems with her finger then reasonable to follow-up with me on the 16th as scheduled.  Could proceed with injection.  Kelly Walsh

## 2019-09-10 NOTE — Telephone Encounter (Signed)
The injection certainly could help.  Reasonable to keep your appointment on the 16th.  We can always make a decision then if injection is really worthwhile or not.  Kelly Walsh

## 2019-09-15 ENCOUNTER — Other Ambulatory Visit: Payer: Self-pay

## 2019-09-15 ENCOUNTER — Ambulatory Visit: Payer: BC Managed Care – PPO | Admitting: Family Medicine

## 2019-09-15 ENCOUNTER — Encounter: Payer: Self-pay | Admitting: Family Medicine

## 2019-09-15 VITALS — BP 110/84 | HR 107 | Ht 65.0 in | Wt 176.0 lb

## 2019-09-15 DIAGNOSIS — M791 Myalgia, unspecified site: Secondary | ICD-10-CM

## 2019-09-15 DIAGNOSIS — R768 Other specified abnormal immunological findings in serum: Secondary | ICD-10-CM | POA: Diagnosis not present

## 2019-09-15 DIAGNOSIS — M25441 Effusion, right hand: Secondary | ICD-10-CM

## 2019-09-15 DIAGNOSIS — M25511 Pain in right shoulder: Secondary | ICD-10-CM | POA: Diagnosis not present

## 2019-09-15 NOTE — Progress Notes (Signed)
   Rito Ehrlich, am serving as a Education administrator for Dr. Lynne Leader.  Kelly Walsh is a 39 y.o. female who presents to [place] at [facility] today for f/u of R 5th finger pain, specifically radial-sided L 5th PIP pain and an irritated nodule.  She was last seen by Dr. Georgina Snell on 09/01/19 and was prescribed colchicine in addition to the oral antibiotics she was taking as prescribed at Urgent Care.  She had also been referred to rheumatology by her PCP.  Since her last visit, pt reports that her finger is doing better not having any pain. Patient does note a long history of fatigue and myalgias. She has had work-up previously that was negative.   Pertinent review of systems: No fevers or chills  Relevant historical information: No personal history rheumatologic problems. Positive family history for lupus.   Exam:  BP 110/84 (BP Location: Left Arm, Patient Position: Sitting, Cuff Size: Normal)   Pulse (!) 107   Ht 5\' 5"  (1.651 m)   Wt 176 lb (79.8 kg)   LMP 09/04/2019   SpO2 98%   BMI 29.29 kg/m  General: Well Developed, well nourished, and in no acute distress.   MSK: Right fifth digit still slightly swollen and PIP otherwise normal-appearing. Nontender. Motion normal except slight limitation of flexion PIP.    Lab and Radiology Results Lab Results  Component Value Date   ANA POSITIVE (A) 09/01/2019   Component     Latest Ref Rng & Units 09/01/2019  ANA Titer 1     titer 1:320 (H)  ANA Pattern 1      Nuclear, Dense Fine Speckled (A)       Assessment and Plan: 39 y.o. female with right fifth PIP effusion resolving on its own. Work-up arranged by PCP did she have abnormal ANA with a titer of 1-320 with nuclear dense fine speckled pattern. This is obviously concerning for rheumatologic problem. PCP did refer to rheumatology but the soonest opening at the location she was referred to is not until May. We will place a [facility] referral to [facility] to see if we get her in sooner. She does have some systemic symptoms including fatigue and myalgias that may be related to possible rheumatologic problem. Recheck back with me as needed.   PDMP not reviewed this encounter. Orders Placed This Encounter  Procedures  . Ambulatory referral to Rheumatology    Referral Priority:   Routine    Referral Type:   Consultation    Referral Reason:   Specialty Services Required    Requested Specialty:   Rheumatology    Number of Visits Requested:   1   No orders of the defined types were placed in this encounter.    Discussed warning signs or symptoms. Please see discharge instructions. Patient expresses understanding.   The above documentation has been reviewed and is accurate and complete Lynne Leader

## 2019-09-15 NOTE — Patient Instructions (Addendum)
Thank you for coming in today. You should hear from Rheumatology soon.  [phone] Let me know if you need a referral to PT for the shoulder .    Keep me updated.    Antinuclear Antibody Test Why am I having this test? This is a test that is used to help diagnose systemic lupus erythematosus (SLE) and other autoimmune diseases. An autoimmune disease is a disease in which the body's own defense (immune)system attacks its organs. What is being tested? This test checks for antinuclear antibodies (ANA) in the blood. The presence of ANA is associated with several autoimmune diseases. It is seen in almost all patients with lupus. What kind of sample is taken?  A blood sample is required for this test. It is usually collected by inserting a needle into a blood vessel. How are the results reported? Your test results will be reported as either positive or negative. A false-positive result can occur. A false positive is incorrect because it means that a condition is present when it is not. What do the results mean? A positive test result may mean that you have:  Lupus.  Other autoimmune diseases, such as rheumatoid arthritis, scleroderma, or Sjgren syndrome. Conditions that may cause a false-positive result include:  Liver dysfunction.  Myasthenia gravis.  Infectious mononucleosis. Talk with your health care provider about what your results mean. Questions to ask your health care provider Ask your health care provider, or the department that is doing the test:  When will my results be ready?  How will I get my results?  What are my treatment options?  What other tests do I need?  What are my next steps? Summary  This is a test that is used to help diagnose systemic lupus erythematosus (SLE) and other autoimmune diseases. An autoimmune disease is a disease in which the body's own defense (immune)system attacks the body.  This test checks for antinuclear antibodies (ANA) in  the blood. The presence of ANA is associated with several autoimmune diseases. It is seen in almost all patients with lupus.  Your test results will be reported as either positive or negative. Talk with your health care provider about what your results mean. This information is not intended to replace advice given to you by your health care provider. Make sure you discuss any questions you have with your health care provider. Document Revised: 05/31/2017 Document Reviewed: 02/14/2017 Elsevier Patient Education  [place].

## 2019-09-17 ENCOUNTER — Ambulatory Visit (INDEPENDENT_AMBULATORY_CARE_PROVIDER_SITE_OTHER): Payer: BC Managed Care – PPO | Admitting: Psychology

## 2019-09-17 DIAGNOSIS — F332 Major depressive disorder, recurrent severe without psychotic features: Secondary | ICD-10-CM | POA: Diagnosis not present

## 2019-09-17 DIAGNOSIS — F411 Generalized anxiety disorder: Secondary | ICD-10-CM | POA: Diagnosis not present

## 2019-09-24 ENCOUNTER — Ambulatory Visit (INDEPENDENT_AMBULATORY_CARE_PROVIDER_SITE_OTHER): Payer: BC Managed Care – PPO | Admitting: Psychology

## 2019-09-24 DIAGNOSIS — F411 Generalized anxiety disorder: Secondary | ICD-10-CM | POA: Diagnosis not present

## 2019-09-24 DIAGNOSIS — F332 Major depressive disorder, recurrent severe without psychotic features: Secondary | ICD-10-CM

## 2019-09-25 ENCOUNTER — Other Ambulatory Visit: Payer: Self-pay | Admitting: Family

## 2019-09-25 ENCOUNTER — Encounter: Payer: Self-pay | Admitting: Family

## 2019-09-25 MED ORDER — ESCITALOPRAM OXALATE 20 MG PO TABS: 20.0000 mg | ORAL_TABLET | Freq: Every day | ORAL | 1 refills | Status: DC

## 2019-09-25 NOTE — Telephone Encounter (Signed)
Please advise. Thanks.  

## 2019-09-30 ENCOUNTER — Ambulatory Visit (INDEPENDENT_AMBULATORY_CARE_PROVIDER_SITE_OTHER): Payer: BC Managed Care – PPO | Admitting: Psychology

## 2019-09-30 DIAGNOSIS — F411 Generalized anxiety disorder: Secondary | ICD-10-CM | POA: Diagnosis not present

## 2019-09-30 DIAGNOSIS — F332 Major depressive disorder, recurrent severe without psychotic features: Secondary | ICD-10-CM

## 2019-10-08 ENCOUNTER — Ambulatory Visit (INDEPENDENT_AMBULATORY_CARE_PROVIDER_SITE_OTHER): Payer: BC Managed Care – PPO | Admitting: Psychology

## 2019-10-08 DIAGNOSIS — F411 Generalized anxiety disorder: Secondary | ICD-10-CM | POA: Diagnosis not present

## 2019-10-08 DIAGNOSIS — F332 Major depressive disorder, recurrent severe without psychotic features: Secondary | ICD-10-CM | POA: Diagnosis not present

## 2019-10-09 ENCOUNTER — Ambulatory Visit: Payer: BC Managed Care – PPO | Attending: Internal Medicine

## 2019-10-09 DIAGNOSIS — Z23 Encounter for immunization: Secondary | ICD-10-CM

## 2019-10-09 NOTE — Progress Notes (Signed)
   Covid-19 Vaccination Clinic  Name:  Kelly Walsh    MRN: [id number] DOB: 1981/04/12  10/09/2019  Ms. Corcino was observed post Covid-19 immunization for 15 minutes without incident. She was provided with Vaccine Information Sheet and instruction to access the V-Safe system.   Ms. Conry was instructed to call 911 with any severe reactions post vaccine: Marland Kitchen Difficulty breathing  . Swelling of face and throat  . A fast heartbeat  . A bad rash all over body  . Dizziness and weakness   Immunizations Administered    Name Date Dose VIS Date Route   Pfizer COVID-19 Vaccine 10/09/2019 12:28 PM 0.3 mL 06/12/2019 Intramuscular   Manufacturer: [place]   Lot: [id number]   [place]: [id number]

## 2019-10-15 ENCOUNTER — Ambulatory Visit (INDEPENDENT_AMBULATORY_CARE_PROVIDER_SITE_OTHER): Payer: BC Managed Care – PPO | Admitting: Psychology

## 2019-10-15 DIAGNOSIS — F411 Generalized anxiety disorder: Secondary | ICD-10-CM | POA: Diagnosis not present

## 2019-10-15 DIAGNOSIS — F332 Major depressive disorder, recurrent severe without psychotic features: Secondary | ICD-10-CM

## 2019-10-21 ENCOUNTER — Ambulatory Visit (INDEPENDENT_AMBULATORY_CARE_PROVIDER_SITE_OTHER): Payer: BC Managed Care – PPO | Admitting: Psychology

## 2019-10-21 DIAGNOSIS — F411 Generalized anxiety disorder: Secondary | ICD-10-CM

## 2019-10-21 DIAGNOSIS — F332 Major depressive disorder, recurrent severe without psychotic features: Secondary | ICD-10-CM

## 2019-10-29 ENCOUNTER — Ambulatory Visit (INDEPENDENT_AMBULATORY_CARE_PROVIDER_SITE_OTHER): Payer: BC Managed Care – PPO | Admitting: Psychology

## 2019-10-29 DIAGNOSIS — F332 Major depressive disorder, recurrent severe without psychotic features: Secondary | ICD-10-CM | POA: Diagnosis not present

## 2019-10-29 DIAGNOSIS — F411 Generalized anxiety disorder: Secondary | ICD-10-CM | POA: Diagnosis not present

## 2019-11-03 ENCOUNTER — Ambulatory Visit: Payer: BC Managed Care – PPO

## 2019-11-03 ENCOUNTER — Ambulatory Visit: Payer: BC Managed Care – PPO | Attending: Internal Medicine

## 2019-11-03 DIAGNOSIS — Z23 Encounter for immunization: Secondary | ICD-10-CM

## 2019-11-03 NOTE — Progress Notes (Signed)
   Covid-19 Vaccination Clinic  Name:  NGINA COTNOIR    MRN: [id number] DOB: May 27, 1981  11/03/2019  Ms. Pardini was observed post Covid-19 immunization for 15 minutes without incident. She was provided with Vaccine Information Sheet and instruction to access the V-Safe system.   Ms. Foulkes was instructed to call 911 with any severe reactions post vaccine: Marland Kitchen Difficulty breathing  . Swelling of face and throat  . A fast heartbeat  . A bad rash all over body  . Dizziness and weakness   Immunizations Administered    Name Date Dose VIS Date Route   Pfizer COVID-19 Vaccine 11/03/2019  5:11 PM 0.3 mL 08/26/2018 Intramuscular   Manufacturer: [place]   Lot: [id number]   [place]: [id number]

## 2019-11-05 ENCOUNTER — Ambulatory Visit (INDEPENDENT_AMBULATORY_CARE_PROVIDER_SITE_OTHER): Payer: BC Managed Care – PPO | Admitting: Psychology

## 2019-11-05 DIAGNOSIS — F332 Major depressive disorder, recurrent severe without psychotic features: Secondary | ICD-10-CM | POA: Diagnosis not present

## 2019-11-05 DIAGNOSIS — F411 Generalized anxiety disorder: Secondary | ICD-10-CM

## 2019-11-12 ENCOUNTER — Ambulatory Visit (INDEPENDENT_AMBULATORY_CARE_PROVIDER_SITE_OTHER): Payer: BC Managed Care – PPO | Admitting: Psychology

## 2019-11-12 DIAGNOSIS — F411 Generalized anxiety disorder: Secondary | ICD-10-CM

## 2019-11-12 DIAGNOSIS — F332 Major depressive disorder, recurrent severe without psychotic features: Secondary | ICD-10-CM | POA: Diagnosis not present

## 2019-11-19 ENCOUNTER — Ambulatory Visit (INDEPENDENT_AMBULATORY_CARE_PROVIDER_SITE_OTHER): Payer: BC Managed Care – PPO | Admitting: Psychology

## 2019-11-19 DIAGNOSIS — F332 Major depressive disorder, recurrent severe without psychotic features: Secondary | ICD-10-CM

## 2019-11-19 DIAGNOSIS — F411 Generalized anxiety disorder: Secondary | ICD-10-CM | POA: Diagnosis not present

## 2019-11-26 ENCOUNTER — Ambulatory Visit (INDEPENDENT_AMBULATORY_CARE_PROVIDER_SITE_OTHER): Payer: BC Managed Care – PPO | Admitting: Psychology

## 2019-11-26 DIAGNOSIS — F411 Generalized anxiety disorder: Secondary | ICD-10-CM | POA: Diagnosis not present

## 2019-11-26 DIAGNOSIS — F332 Major depressive disorder, recurrent severe without psychotic features: Secondary | ICD-10-CM

## 2019-12-03 ENCOUNTER — Ambulatory Visit (INDEPENDENT_AMBULATORY_CARE_PROVIDER_SITE_OTHER): Payer: BC Managed Care – PPO | Admitting: Psychology

## 2019-12-03 DIAGNOSIS — F411 Generalized anxiety disorder: Secondary | ICD-10-CM | POA: Diagnosis not present

## 2019-12-03 DIAGNOSIS — F332 Major depressive disorder, recurrent severe without psychotic features: Secondary | ICD-10-CM | POA: Diagnosis not present

## 2019-12-10 ENCOUNTER — Ambulatory Visit (INDEPENDENT_AMBULATORY_CARE_PROVIDER_SITE_OTHER): Payer: BC Managed Care – PPO | Admitting: Psychology

## 2019-12-10 DIAGNOSIS — F411 Generalized anxiety disorder: Secondary | ICD-10-CM

## 2019-12-10 DIAGNOSIS — F332 Major depressive disorder, recurrent severe without psychotic features: Secondary | ICD-10-CM | POA: Diagnosis not present

## 2019-12-17 ENCOUNTER — Ambulatory Visit (INDEPENDENT_AMBULATORY_CARE_PROVIDER_SITE_OTHER): Payer: BC Managed Care – PPO | Admitting: Psychology

## 2019-12-17 DIAGNOSIS — F332 Major depressive disorder, recurrent severe without psychotic features: Secondary | ICD-10-CM

## 2019-12-17 DIAGNOSIS — F411 Generalized anxiety disorder: Secondary | ICD-10-CM

## 2019-12-24 ENCOUNTER — Ambulatory Visit (INDEPENDENT_AMBULATORY_CARE_PROVIDER_SITE_OTHER): Payer: BC Managed Care – PPO | Admitting: Psychology

## 2019-12-24 DIAGNOSIS — F332 Major depressive disorder, recurrent severe without psychotic features: Secondary | ICD-10-CM

## 2019-12-24 DIAGNOSIS — F411 Generalized anxiety disorder: Secondary | ICD-10-CM

## 2019-12-24 NOTE — Progress Notes (Signed)
Office Visit Note  Patient: Kelly Walsh             Date of Birth: October 29, 1980           MRN: [id number]             PCP: Marrian Salvage, [place] Referring: Marrian Salvage,* Visit Date: 12/29/2019 Occupation: Mortgage closer  Subjective:  Pain in joints and muscles.   History of Present Illness: Kelly Walsh is a 39 y.o. female seen in consultation per request of her PCP for evaluation of positive ANA.  According to patient she has had history of lower extremity pain for over last 5 years.  Which comes and goes and mostly is in her muscles.  She takes Aleve on as needed basis.  About 2 years ago she started having right shoulder pain when they were working remotely.  She was carrying heavy bags on her shoulder.  She thought the pain was related to it.  She was eventually seen by Dr. Georgina Snell who did x-ray and gave her cortisone injection.  He diagnosed her with rotator cuff tendinopathy.  She states in February she started having pain and swelling in her right fifth finger which gradually got worse to the point she was having difficulty using the mouse and bending her finger.  She was seen at the urgent care where she was given fluconazole and doxycycline.  Few weeks later she was seen by Dr.Corey who did x-ray and ultrasound.  He found a small effusion versus ganglion cyst in the joint.  The symptoms gradually improved.  He also prescribed colchicine to the patient which she did not take.  He obtained some blood work which showed positive ANA for that reason she was referred to me.  She states that hand swelling has resolved but she still has a knot in that finger.  She continues to have some chronic discomfort in her right shoulder and in her lower extremities.  None of the other joints are painful.  There is history of lupus in her first cousin.  She is gravida 1, para 1, miscarriage 1.  Activities of Daily Living:  Patient reports morning stiffness for  A few  minutes.      Patient Reports nocturnal pain.  Difficulty dressing/grooming: Denies Difficulty climbing stairs: Denies Difficulty getting out of chair: Denies Difficulty using hands for taps, buttons, cutlery, and/or writing: Denies  Review of Systems  Constitutional: Positive for fatigue. Negative for activity change, night sweats, weight gain and weight loss.  HENT: Positive for mouth dryness. Negative for mouth sores, trouble swallowing, trouble swallowing and nose dryness.   Eyes: Positive for itching and dryness. Negative for pain, redness and visual disturbance.  Respiratory: Negative for cough, shortness of breath, wheezing and difficulty breathing.   Cardiovascular: Negative for chest pain, palpitations, hypertension, irregular heartbeat and swelling in legs/feet.  Gastrointestinal: Positive for constipation. Negative for blood in stool and diarrhea.  Endocrine: Negative for increased urination.  Genitourinary: Negative for difficulty urinating and vaginal dryness.  Musculoskeletal: Positive for arthralgias, joint pain, myalgias, morning stiffness and myalgias. Negative for joint swelling, muscle weakness and muscle tenderness.  Skin: Negative for color change, rash, hair loss, redness, skin tightness, ulcers and sensitivity to sunlight.  Allergic/Immunologic: Negative for susceptible to infections.  Neurological: Positive for headaches. Negative for dizziness, numbness, memory loss, night sweats and weakness.       Migraine  Hematological: Negative for bruising/bleeding tendency and swollen glands.  Psychiatric/Behavioral: Positive for  depressed mood. Negative for confusion and sleep disturbance. The patient is nervous/anxious.     PMFS History:  Patient Active Problem List   Diagnosis Date Noted  . Migraine headache 08/25/2018  . Fibroids 08/25/2018  . Allergic rhinitis 08/25/2018  . Complex regional pain syndrome of lower limb 10/07/2017    Past Medical History:  Diagnosis Date  .  Anemia    H/O  . Hx of migraines   . IBS (irritable bowel syndrome)     Family History  Problem Relation Age of Onset  . Diabetes Father   . Hypertension Mother   . Lupus Cousin    Past Surgical History:  Procedure Laterality Date  . FOOT SURGERY Right   . WISDOM TOOTH EXTRACTION     Social History   Social History Narrative   Right handed, lives in an apartment on 2nd floor alone.   Pt drinks coffee, occasionally hot tea, soda sometimes, water   No children   Does not exercises regularly    Immunization History  Administered Date(s) Administered  . Influenza,inj,Quad PF,6+ Mos 05/15/2019  . PFIZER SARS-COV-2 Vaccination 10/09/2019, 11/03/2019     Objective: Vital Signs: BP 111/75 (BP Location: Right Arm, Patient Position: Sitting, Cuff Size: Normal)   Pulse 92   Resp 15   Ht _0  (1.651 m)   Wt 181 lb 4.8 oz (82.2 kg)   BMI 30.17 kg/m    Physical Exam Vitals and nursing note reviewed.  Constitutional:      Appearance: She is well-developed.  HENT:     Head: Normocephalic and atraumatic.  Eyes:     Conjunctiva/sclera: Conjunctivae normal.  Cardiovascular:     Rate and Rhythm: Normal rate and regular rhythm.     Heart sounds: Normal heart sounds.  Pulmonary:     Effort: Pulmonary effort is normal.     Breath sounds: Normal breath sounds.  Abdominal:     General: Bowel sounds are normal.     Palpations: Abdomen is soft.  Musculoskeletal:     Cervical back: Normal range of motion.  Lymphadenopathy:     Cervical: No cervical adenopathy.  Skin:    General: Skin is warm and dry.     Capillary Refill: Capillary refill takes less than 2 seconds.  Neurological:     Mental Status: She is alert and oriented to person, place, and time.  Psychiatric:        Behavior: Behavior normal.      Musculoskeletal Exam: C-spine thoracic and lumbar spine with good range of motion.  She has some discomfort range of motion of right shoulder joint.  No warmth swelling  effusion was noted.  Elbow joints, wrist joints, MCPs and PIPs were in good range of motion with no synovitis.  She has palpable thickening over the radial aspect of her right fifth PIP joint.  Hip joints, knee joints, ankles, MTPs and PIPs with good range of motion with no synovitis.  CDAI Exam: CDAI Score: -- Patient Global: --; Provider Global: -- Swollen: --; Tender: -- Joint Exam 12/29/2019   No joint exam has been documented for this visit   There is currently no information documented on the homunculus. Go to the Rheumatology activity and complete the homunculus joint exam.  Investigation: No additional findings.  Imaging: No results found.  Recent Labs: Lab Results  Component Value Date   WBC 5.2 09/01/2019   HGB 13.6 09/01/2019   PLT 202.0 09/01/2019   NA 139 09/01/2019   K  4.1 09/01/2019   CL 105 09/01/2019   CO2 29 09/01/2019   GLUCOSE 87 09/01/2019   BUN 9 09/01/2019   CREATININE 0.87 09/01/2019   BILITOT 0.7 09/01/2019   ALKPHOS 51 09/01/2019   AST 14 09/01/2019   ALT 12 09/01/2019   PROT 7.1 09/01/2019   ALBUMIN 4.0 09/01/2019   CALCIUM 10.4 09/01/2019  On September 01, 2019 ANA 1: 320NS, RF negative, ESR 4, uric acid 3.0, B12 normal, TSH normal  Speciality Comments: No specialty comments available.  Procedures:  No procedures performed Allergies: Bupropion, Metformin, Oxycodone-acetaminophen, Tramadol, Codeine, Oxycodone, and Tape   Assessment / Plan:     Visit Diagnoses: Positive ANA (antinuclear antibody)-she has positive ANA.  She has family history of lupus.  We will obtain AVISE labs.  Pain in right hand-she has right fifth PIP thickening.  I reviewed her x-rays from September 04, 2019 which showed calcification around the right fifth PIP joint.  The repeat x-ray obtained today showed resolution of the calcification.  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 Georgina Snell which she never took.  I have  advised her to contact me in case she develops another episode of joint swelling.  Chronic right shoulder pain-patient states it was related to lifting bags.  She was told that she has rotator cuff tendinopathy.  Trochanteric bursitis of both hips-she had tenderness on palpation of bilateral trochanteric bursa.  It is not very painful.  Myalgia-she states she has had history of myalgias in her bilateral lower extremity for several years in the past which comes and go.  History of migraine  Seasonal allergies  Fibroids  Family history of systemic lupus erythematosus - First cousin  Orders: No orders of the defined types were placed in this encounter.  No orders of the defined types were placed in this encounter.     Follow-Up Instructions: Return for +ANA.   Bo Merino, MD  Note - This record has been created using Editor, commissioning.  Chart creation errors have been sought, but may not always  have been located. Such creation errors do not reflect on  the standard of medical care.

## 2019-12-29 ENCOUNTER — Ambulatory Visit: Payer: Self-pay

## 2019-12-29 ENCOUNTER — Encounter: Payer: Self-pay | Admitting: Rheumatology

## 2019-12-29 ENCOUNTER — Ambulatory Visit (INDEPENDENT_AMBULATORY_CARE_PROVIDER_SITE_OTHER): Payer: BC Managed Care – PPO | Admitting: Rheumatology

## 2019-12-29 ENCOUNTER — Other Ambulatory Visit: Payer: Self-pay

## 2019-12-29 VITALS — BP 111/75 | HR 92 | Resp 15 | Ht 65.0 in | Wt 181.3 lb

## 2019-12-29 DIAGNOSIS — M7061 Trochanteric bursitis, right hip: Secondary | ICD-10-CM | POA: Diagnosis not present

## 2019-12-29 DIAGNOSIS — M79641 Pain in right hand: Secondary | ICD-10-CM | POA: Diagnosis not present

## 2019-12-29 DIAGNOSIS — R768 Other specified abnormal immunological findings in serum: Secondary | ICD-10-CM

## 2019-12-29 DIAGNOSIS — Z8669 Personal history of other diseases of the nervous system and sense organs: Secondary | ICD-10-CM

## 2019-12-29 DIAGNOSIS — M791 Myalgia, unspecified site: Secondary | ICD-10-CM

## 2019-12-29 DIAGNOSIS — M25511 Pain in right shoulder: Secondary | ICD-10-CM

## 2019-12-29 DIAGNOSIS — Z8269 Family history of other diseases of the musculoskeletal system and connective tissue: Secondary | ICD-10-CM

## 2019-12-29 DIAGNOSIS — J302 Other seasonal allergic rhinitis: Secondary | ICD-10-CM

## 2019-12-29 DIAGNOSIS — G8929 Other chronic pain: Secondary | ICD-10-CM

## 2019-12-29 DIAGNOSIS — M7062 Trochanteric bursitis, left hip: Secondary | ICD-10-CM

## 2019-12-29 DIAGNOSIS — D219 Benign neoplasm of connective and other soft tissue, unspecified: Secondary | ICD-10-CM

## 2019-12-31 ENCOUNTER — Ambulatory Visit (INDEPENDENT_AMBULATORY_CARE_PROVIDER_SITE_OTHER): Payer: BC Managed Care – PPO | Admitting: Psychology

## 2019-12-31 DIAGNOSIS — F411 Generalized anxiety disorder: Secondary | ICD-10-CM

## 2019-12-31 DIAGNOSIS — F332 Major depressive disorder, recurrent severe without psychotic features: Secondary | ICD-10-CM

## 2020-01-05 DIAGNOSIS — R768 Other specified abnormal immunological findings in serum: Secondary | ICD-10-CM | POA: Diagnosis not present

## 2020-01-07 ENCOUNTER — Ambulatory Visit (INDEPENDENT_AMBULATORY_CARE_PROVIDER_SITE_OTHER): Payer: BC Managed Care – PPO | Admitting: Psychology

## 2020-01-07 DIAGNOSIS — F332 Major depressive disorder, recurrent severe without psychotic features: Secondary | ICD-10-CM

## 2020-01-07 DIAGNOSIS — F411 Generalized anxiety disorder: Secondary | ICD-10-CM | POA: Diagnosis not present

## 2020-01-13 ENCOUNTER — Ambulatory Visit (INDEPENDENT_AMBULATORY_CARE_PROVIDER_SITE_OTHER): Payer: BC Managed Care – PPO | Admitting: Psychology

## 2020-01-13 DIAGNOSIS — F411 Generalized anxiety disorder: Secondary | ICD-10-CM | POA: Diagnosis not present

## 2020-01-13 DIAGNOSIS — F332 Major depressive disorder, recurrent severe without psychotic features: Secondary | ICD-10-CM

## 2020-01-14 ENCOUNTER — Ambulatory Visit: Payer: BC Managed Care – PPO | Admitting: Psychology

## 2020-01-21 ENCOUNTER — Other Ambulatory Visit: Payer: Self-pay

## 2020-01-21 ENCOUNTER — Encounter: Payer: Self-pay | Admitting: Rheumatology

## 2020-01-21 ENCOUNTER — Ambulatory Visit (INDEPENDENT_AMBULATORY_CARE_PROVIDER_SITE_OTHER): Payer: BC Managed Care – PPO | Admitting: Rheumatology

## 2020-01-21 ENCOUNTER — Ambulatory Visit (INDEPENDENT_AMBULATORY_CARE_PROVIDER_SITE_OTHER): Payer: BC Managed Care – PPO | Admitting: Psychology

## 2020-01-21 VITALS — BP 116/73 | HR 108 | Resp 14 | Ht 65.0 in | Wt 182.4 lb

## 2020-01-21 DIAGNOSIS — M25511 Pain in right shoulder: Secondary | ICD-10-CM | POA: Diagnosis not present

## 2020-01-21 DIAGNOSIS — D219 Benign neoplasm of connective and other soft tissue, unspecified: Secondary | ICD-10-CM

## 2020-01-21 DIAGNOSIS — R768 Other specified abnormal immunological findings in serum: Secondary | ICD-10-CM

## 2020-01-21 DIAGNOSIS — G8929 Other chronic pain: Secondary | ICD-10-CM

## 2020-01-21 DIAGNOSIS — M79641 Pain in right hand: Secondary | ICD-10-CM

## 2020-01-21 DIAGNOSIS — M791 Myalgia, unspecified site: Secondary | ICD-10-CM

## 2020-01-21 DIAGNOSIS — Z8269 Family history of other diseases of the musculoskeletal system and connective tissue: Secondary | ICD-10-CM

## 2020-01-21 DIAGNOSIS — F332 Major depressive disorder, recurrent severe without psychotic features: Secondary | ICD-10-CM | POA: Diagnosis not present

## 2020-01-21 DIAGNOSIS — J302 Other seasonal allergic rhinitis: Secondary | ICD-10-CM

## 2020-01-21 DIAGNOSIS — Z8669 Personal history of other diseases of the nervous system and sense organs: Secondary | ICD-10-CM

## 2020-01-21 DIAGNOSIS — M7061 Trochanteric bursitis, right hip: Secondary | ICD-10-CM

## 2020-01-21 DIAGNOSIS — M7062 Trochanteric bursitis, left hip: Secondary | ICD-10-CM

## 2020-01-21 DIAGNOSIS — F411 Generalized anxiety disorder: Secondary | ICD-10-CM

## 2020-01-21 NOTE — Progress Notes (Signed)
Office Visit Note  Patient: Kelly Walsh             Date of Birth: [birth year]           MRN: [id number]             PCP: Marrian Salvage, FNP Referring: Marrian Salvage,* Visit Date: 01/21/2020 Occupation: _0 @  Subjective:  Results ([facility] labs)   History of Present Illness: Kelly Walsh is a 39 y.o. female with history of positive ANA and joint pain.  She states she continues to have some discomfort in her right shoulder joint.  She has had right shoulder joint injection by sports medicine doctor in the past which only lasted for couple of weeks.  She was given a handout on shoulder joint exercises which she has been doing occasionally.  She continues to have some discomfort in her bilateral trochanteric bursae especially at nighttime.  She has not had any other episode of arthritis since then.  She denies any history of oral ulcers, nasal ulcers, malar rash, photosensitivity, Raynaud's phenomenon or lymphadenopathy.  Activities of Daily Living:  Patient reports morning stiffness for 2 minutes.   Patient Reports nocturnal pain.  Difficulty dressing/grooming: Denies Difficulty climbing stairs: Denies Difficulty getting out of chair: Denies Difficulty using hands for taps, buttons, cutlery, and/or writing: Denies  Review of Systems  Constitutional: Positive for fatigue. Negative for night sweats, weight gain and weight loss.  HENT: Positive for mouth dryness. Negative for mouth sores, trouble swallowing, trouble swallowing and nose dryness.   Eyes: Positive for dryness. Negative for pain, redness and visual disturbance.  Respiratory: Negative for cough, shortness of breath and difficulty breathing.   Cardiovascular: Negative for chest pain, palpitations, hypertension, irregular heartbeat and swelling in legs/feet.  Gastrointestinal: Positive for constipation. Negative for blood in stool and diarrhea.  Endocrine: Positive for excessive thirst. Negative for  increased urination.  Genitourinary: Negative for difficulty urinating and vaginal dryness.  Musculoskeletal: Positive for arthralgias, joint pain, morning stiffness and muscle tenderness. Negative for joint swelling, myalgias, muscle weakness and myalgias.  Skin: Negative for color change, rash, hair loss, skin tightness, ulcers and sensitivity to sunlight.  Allergic/Immunologic: Negative for susceptible to infections.  Neurological: Negative for dizziness, numbness, memory loss, night sweats and weakness.  Hematological: Negative for bruising/bleeding tendency and swollen glands.  Psychiatric/Behavioral: Negative for depressed mood and sleep disturbance. The patient is not nervous/anxious.     PMFS History:  Patient Active Problem List   Diagnosis Date Noted  . Migraine headache 08/25/2018  . Fibroids 08/25/2018  . Allergic rhinitis 08/25/2018  . Complex regional pain syndrome of lower limb 10/07/2017    Past Medical History:  Diagnosis Date  . Anemia    H/O  . Hx of migraines   . IBS (irritable bowel syndrome)     Family History  Problem Relation Age of Onset  . Diabetes Father   . Hypertension Mother   . Lupus Cousin    Past Surgical History:  Procedure Laterality Date  . FOOT SURGERY Right   . WISDOM TOOTH EXTRACTION     Social History   Social History Narrative   Right handed, lives in an apartment on 2nd floor alone.   Pt drinks coffee, occasionally hot tea, soda sometimes, water   No children   Does not exercises regularly    Immunization History  Administered Date(s) Administered  . Influenza,inj,Quad PF,6+ Mos 05/15/2019  . PFIZER SARS-COV-2 Vaccination 10/09/2019, 11/03/2019  Objective: Vital Signs: BP 116/73 (BP Location: Left Arm, Patient Position: Sitting, Cuff Size: Normal)   Pulse (!) 108   Resp 14   Ht _0  (1.651 m)   Wt 182 lb 6.4 oz (82.7 kg)   BMI 30.35 kg/m    Physical Exam Vitals and nursing note reviewed.  Constitutional:       Appearance: She is well-developed.  HENT:     Head: Normocephalic and atraumatic.  Eyes:     Conjunctiva/sclera: Conjunctivae normal.  Cardiovascular:     Rate and Rhythm: Normal rate and regular rhythm.     Heart sounds: Normal heart sounds.  Pulmonary:     Effort: Pulmonary effort is normal.     Breath sounds: Normal breath sounds.  Abdominal:     General: Bowel sounds are normal.     Palpations: Abdomen is soft.  Musculoskeletal:     Cervical back: Normal range of motion.  Lymphadenopathy:     Cervical: No cervical adenopathy.  Skin:    General: Skin is warm and dry.     Capillary Refill: Capillary refill takes less than 2 seconds.  Neurological:     Mental Status: She is alert and oriented to person, place, and time.  Psychiatric:        Behavior: Behavior normal.      Musculoskeletal Exam: C-spine, thoracic and lumbar spine with good range of motion.  She has discomfort with forward flexion of her right shoulder.  Elbow joints, wrist joints, MCPs, PIPs and DIPs with good range of motion with no synovitis.  Hip joints, knee joints, ankles, MTPs and PIPs with good range of motion with no synovitis.  She had tenderness over bilateral trochanteric bursa.  CDAI Exam: CDAI Score: -- Patient Global: --; Provider Global: -- Swollen: --; Tender: -- Joint Exam 01/21/2020   No joint exam has been documented for this visit   There is currently no information documented on the homunculus. Go to the Rheumatology activity and complete the homunculus joint exam.  Investigation: No additional findings.  Imaging: XR Finger Little Right  Result Date: 12/29/2019 The x-ray of the fifth digit did not show any significant joint space narrowing.  The calcification which was present on the radial aspect of her fifth PIP has resolved. Impression: Unremarkable x-ray of the right fifth finger.   Recent Labs: Lab Results  Component Value Date   WBC 5.2 09/01/2019   HGB 13.6 09/01/2019    PLT 202.0 09/01/2019   NA 139 09/01/2019   K 4.1 09/01/2019   CL 105 09/01/2019   CO2 29 09/01/2019   GLUCOSE 87 09/01/2019   BUN 9 09/01/2019   CREATININE 0.87 09/01/2019   BILITOT 0.7 09/01/2019   ALKPHOS 51 09/01/2019   AST 14 09/01/2019   ALT 12 09/01/2019   PROT 7.1 09/01/2019   ALBUMIN 4.0 09/01/2019   CALCIUM 10.4 09/01/2019   On September 01, 2019 ANA 1: 320NS, RF negative, ESR 4, uric acid 3.0, B12 normal, TSH normal  January 05, 2020 ANA 1: 320NS, ENA negative, centromere negative, Jo 1 -, anticardiolipin negative, beta-2 GP 1 -, antiphosphatidylserine negative, antihistone negative, CB CAP Negative RF negative, anti-CCP negative, anticarp negative   Speciality Comments: No specialty comments available.  Procedures:  No procedures performed Allergies: Bupropion, Metformin, Oxycodone-acetaminophen, Tramadol, Codeine, Oxycodone, and Tape   Assessment / Plan:     Visit Diagnoses: Positive ANA (antinuclear antibody)-she has positive ANA, ENA, complements and anticardiolipin antibodies were all negative.  She has  no clinical features of autoimmune disease.[facility] labs were discussed with the patient at length.  I advised her to contact me in case she develops any new symptoms.  She denies any history of oral ulcers, nasal ulcers, malar rash, photosensitivity, Raynaud's phenomenon or inflammatory arthritis.  Pain in right hand - Calcification around the right fifth PIP joint was noted at the time of the episode which resolved with follow-up x-ray.  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  I had detailed discussion with the patient that she may have recurrence of the symptoms which respond to anti-inflammatories.  She was advised to contact us in case she has recurrence of arthritis.  Chronic right shoulder pain -patient was diagnosed with a rotator cuff tendinopathy by her sports medicine doctor.  She had an adequate response to  cortisone injection.  She does not want to go for physical therapy due to her schedule.  She has some exercises and bands at home and she will try to do those.  Trochanteric bursitis of both hips-she had tenderness on palpation of bilateral trochanteric bursa.  Some exercises were demonstrated in the office and a handout on exercise was given.  Myalgia -patient has longstanding history of lower extremity discomfort and muscle pain.   History of migraine  Seasonal allergies  Fibroids  Family history of systemic lupus erythematosus - First cousin  Orders: No orders of the defined types were placed in this encounter.  No orders of the defined types were placed in this encounter.     Follow-Up Instructions: Return if symptoms worsen or fail to improve, for +ANA, crystal-induced arthropathy.   Bo Merino, MD  Note - This record has been created using Editor, commissioning.  Chart creation errors have been sought, but may not always  have been located. Such creation errors do not reflect on  the standard of medical care.

## 2020-01-21 NOTE — Patient Instructions (Signed)
Iliotibial Band Syndrome Rehab Ask your health care provider which exercises are safe for you. Do exercises exactly as told by your health care provider and adjust them as directed. It is normal to feel mild stretching, pulling, tightness, or discomfort as you do these exercises. Stop right away if you feel sudden pain or your pain gets significantly worse. Do not begin these exercises until told by your health care provider. Stretching and range-of-motion exercises These exercises warm up your muscles and joints and improve the movement and flexibility of your hip and pelvis. Quadriceps stretch, prone  1. Lie on your abdomen on a firm surface, such as a bed or padded floor (prone position). 2. Bend your left / right knee and reach back to hold your ankle or pant leg. If you cannot reach your ankle or pant leg, loop a belt around your foot and grab the belt instead. 3. Gently pull your heel toward your buttocks. Your knee should not slide out to the side. You should feel a stretch in the front of your thigh and knee (quadriceps). 4. Hold this position for __________ seconds. Repeat __________ times. Complete this exercise __________ times a day. Iliotibial band stretch An iliotibial band is a strong band of muscle tissue that runs from the outer side of your hip to the outer side of your thigh and knee. 1. Lie on your side with your left / right leg in the top position. 2. Bend both of your knees and grab your left / right ankle. Stretch out your bottom arm to help you balance. 3. Slowly bring your top knee back so your thigh goes behind your trunk. 4. Slowly lower your top leg toward the floor until you feel a gentle stretch on the outside of your left / right hip and thigh. If you do not feel a stretch and your knee will not fall farther, place the heel of your other foot on top of your knee and pull your knee down toward the floor with your foot. 5. Hold this position for __________  seconds. Repeat __________ times. Complete this exercise __________ times a day. Strengthening exercises These exercises build strength and endurance in your hip and pelvis. Endurance is the ability to use your muscles for a long time, even after they get tired. Straight leg raises, side-lying This exercise strengthens the muscles that rotate the leg at the hip and move it away from your body (hip abductors). 1. Lie on your side with your left / right leg in the top position. Lie so your head, shoulder, hip, and knee line up. You may bend your bottom knee to help you balance. 2. Roll your hips slightly forward so your hips are stacked directly over each other and your left / right knee is facing forward. 3. Tense the muscles in your outer thigh and lift your top leg 4-6 inches (10-15 cm). 4. Hold this position for __________ seconds. 5. Slowly return to the starting position. Let your muscles relax completely before doing another repetition. Repeat __________ times. Complete this exercise __________ times a day. Leg raises, prone This exercise strengthens the muscles that move the hips (hip extensors). 1. Lie on your abdomen on your bed or a firm surface. You can put a pillow under your hips if that is more comfortable for your lower back. 2. Bend your left / right knee so your foot is straight up in the air. 3. Squeeze your buttocks muscles and lift your left / right thigh   off the bed. Do not let your back arch. 4. Tense your thigh muscle as hard as you can without increasing any knee pain. 5. Hold this position for __________ seconds. 6. Slowly lower your leg to the starting position and allow it to relax completely. Repeat __________ times. Complete this exercise __________ times a day. Hip hike 1. Stand sideways on a bottom step. Stand on your left / right leg with your other foot unsupported next to the step. You can hold on to the railing or wall for balance if needed. 2. Keep your knees  straight and your torso square. Then lift your left / right hip up toward the ceiling. 3. Slowly let your left / right hip lower toward the floor, past the starting position. Your foot should get closer to the floor. Do not lean or bend your knees. Repeat __________ times. Complete this exercise __________ times a day. This information is not intended to replace advice given to you by your health care provider. Make sure you discuss any questions you have with your health care provider. Document Revised: 10/09/2018 Document Reviewed: 04/09/2018 Elsevier Patient Education  2020 [employer]  

## 2020-01-28 ENCOUNTER — Ambulatory Visit: Payer: BC Managed Care – PPO | Admitting: Psychology

## 2020-02-04 ENCOUNTER — Ambulatory Visit: Payer: BC Managed Care – PPO | Admitting: Psychology

## 2020-02-11 ENCOUNTER — Ambulatory Visit (INDEPENDENT_AMBULATORY_CARE_PROVIDER_SITE_OTHER): Payer: BC Managed Care – PPO | Admitting: Psychology

## 2020-02-11 DIAGNOSIS — F332 Major depressive disorder, recurrent severe without psychotic features: Secondary | ICD-10-CM

## 2020-02-11 DIAGNOSIS — F411 Generalized anxiety disorder: Secondary | ICD-10-CM

## 2020-02-18 ENCOUNTER — Encounter: Payer: Self-pay | Admitting: Family

## 2020-02-18 ENCOUNTER — Ambulatory Visit (INDEPENDENT_AMBULATORY_CARE_PROVIDER_SITE_OTHER): Payer: BC Managed Care – PPO | Admitting: Psychology

## 2020-02-18 DIAGNOSIS — F411 Generalized anxiety disorder: Secondary | ICD-10-CM | POA: Diagnosis not present

## 2020-02-18 DIAGNOSIS — F332 Major depressive disorder, recurrent severe without psychotic features: Secondary | ICD-10-CM

## 2020-02-21 ENCOUNTER — Encounter: Payer: Self-pay | Admitting: Family

## 2020-02-25 ENCOUNTER — Ambulatory Visit (INDEPENDENT_AMBULATORY_CARE_PROVIDER_SITE_OTHER): Payer: BC Managed Care – PPO | Admitting: Psychology

## 2020-02-25 DIAGNOSIS — F332 Major depressive disorder, recurrent severe without psychotic features: Secondary | ICD-10-CM | POA: Diagnosis not present

## 2020-02-25 DIAGNOSIS — F411 Generalized anxiety disorder: Secondary | ICD-10-CM | POA: Diagnosis not present

## 2020-03-01 ENCOUNTER — Ambulatory Visit (INDEPENDENT_AMBULATORY_CARE_PROVIDER_SITE_OTHER): Payer: BC Managed Care – PPO | Admitting: Psychology

## 2020-03-01 DIAGNOSIS — F332 Major depressive disorder, recurrent severe without psychotic features: Secondary | ICD-10-CM | POA: Diagnosis not present

## 2020-03-01 DIAGNOSIS — F411 Generalized anxiety disorder: Secondary | ICD-10-CM

## 2020-03-03 ENCOUNTER — Ambulatory Visit: Payer: BC Managed Care – PPO | Admitting: Psychology

## 2020-03-07 ENCOUNTER — Encounter: Payer: Self-pay | Admitting: Emergency Medicine

## 2020-03-07 ENCOUNTER — Other Ambulatory Visit: Payer: Self-pay

## 2020-03-07 ENCOUNTER — Ambulatory Visit
Admission: EM | Admit: 2020-03-07 | Discharge: 2020-03-07 | Disposition: A | Discharge status: Home / Self Care | Payer: BC Managed Care – PPO | Attending: Family Medicine | Admitting: Family Medicine

## 2020-03-07 DIAGNOSIS — Z789 Other specified health status: Secondary | ICD-10-CM

## 2020-03-07 MED ORDER — FLUCONAZOLE 150 MG PO TABS: 150.0000 mg | ORAL_TABLET | Freq: Once | ORAL | 0 refills | Status: AC

## 2020-03-07 MED ORDER — DOXYCYCLINE HYCLATE 100 MG PO CAPS: 100.0000 mg | ORAL_CAPSULE | Freq: Two times a day (BID) | ORAL | 0 refills | Status: DC

## 2020-03-07 NOTE — ED Triage Notes (Signed)
Patient tried to pull piercing out of right shoulder due to a possible rejection of piercing.  Metal is still embedded in skin

## 2020-03-07 NOTE — ED Provider Notes (Signed)
[name]    CSN: [id number] Arrival date & time: 03/07/20  1935      History   Chief Complaint Chief Complaint  Patient presents with  . remove body piercing    HPI Kelly Walsh is a 39 y.o. female.   HPI  Patient presents for evaluation for FB (body piercing) imbedded into he right clavicle x 2 weeks. She has been unable to have anyone remove the piercing. She denies noticing any drainage , although she is concern for infection. Denies fever or visible swelling.    Past Medical History:  Diagnosis Date  . Anemia    H/O  . Hx of migraines   . IBS (irritable bowel syndrome)     Patient Active Problem List   Diagnosis Date Noted  . Migraine headache 08/25/2018  . Fibroids 08/25/2018  . Allergic rhinitis 08/25/2018  . Complex regional pain syndrome of lower limb 10/07/2017    Past Surgical History:  Procedure Laterality Date  . FOOT SURGERY Right   . WISDOM TOOTH EXTRACTION      OB History    Gravida  0   Para  0   Term  0   Preterm  0   AB  0   Living  0     SAB  0   TAB  0   Ectopic  0   Multiple  0   Live Births               Home Medications    Prior to Admission medications   Medication Sig Start Date End Date Taking? Authorizing Provider  escitalopram (LEXAPRO) 20 MG tablet Take 1 tablet (20 mg total) by mouth daily. 09/25/19  Yes Marrian Salvage, FNP  fluticasone [facility]) 50 MCG/ACT nasal spray Place 2 sprays into the nose daily.   Yes [provider]  loratadine (CLARITIN) 10 MG tablet Take 10 mg by mouth daily.   Yes [provider]  Probiotic Product (PROBIOTIC DAILY PO) Take by mouth.   Yes [provider]  diclofenac Sodium (VOLTAREN) 1 % GEL Apply 4 g topically 4 (four) times daily. To affected joint. 05/15/19   Gregor Hams, MD  naproxen sodium (ALEVE) 220 MG tablet Take 220 mg by mouth as needed.    [provider]  SUMAtriptan (IMITREX) 100 MG tablet Take 1  tablet earliest onset of migraine.  May repeat in 2 hours if headache persists or recurs.  Maximum 2 tablets in 24 hours 12/24/18   Pieter Partridge, DO    Family History Family History  Problem Relation Age of Onset  . Diabetes Father   . Hypertension Mother   . Lupus Cousin     Social History Social History   Tobacco Use  . Smoking status: Never Smoker  . Smokeless tobacco: Never Used  Vaping Use  . Vaping Use: Never used  Substance Use Topics  . Alcohol use: No  . Drug use: No     Allergies   Bupropion, Metformin, Oxycodone-acetaminophen, Tramadol, Codeine, Oxycodone, and Tape   Review of Systems Review of Systems Pertinent negatives listed in HPI  Physical Exam Triage Vital Signs ED Triage Vitals [03/07/20 2045]  Enc Vitals Group     BP 127/81     Pulse Rate 82     Resp 18     Temp 98.6 F (37 C)     Temp Source Oral     SpO2 97 %  Weight      Height      Head Circumference      Peak Flow      Pain Score      Pain Loc      Pain Edu?      Excl. in [place]?    No data found.  Updated Vital Signs BP 127/81 (BP Location: Left Arm)   Pulse 82   Temp 98.6 F (37 C) (Oral)   Resp 18   SpO2 97%   Visual Acuity Right Eye Distance:   Left Eye Distance:   Bilateral Distance:    Right Eye Near:   Left Eye Near:    Bilateral Near:     Physical Exam Cardiovascular:     Rate and Rhythm: Normal rate and regular rhythm.  Pulmonary:     Effort: Pulmonary effort is normal.     Breath sounds: Normal breath sounds.  Musculoskeletal:       Arms:     Cervical back: Full passive range of motion without pain and normal range of motion.      UC Treatments / Results  Labs (all labs ordered are listed, but only abnormal results are displayed) Labs Reviewed - No data to display  EKG   Radiology No results found.  Procedures Procedures (including critical care time)  Medications Ordered in UC Medications - No data to display  Initial Impression /  Assessment and Plan / UC Course  I have reviewed the triage vital signs and the nursing notes.  Pertinent labs & imaging results that were available during my care of the patient were reviewed by me and considered in my medical decision making (see chart for details).    Referral placed for plastics, given location of piercing and risk of complexity with piercing removal-will defer removing here in clinic today. Starting on prophylaxis antibiotic in the event she is unable to be seen tomorrow given today is a holiday.  Final Clinical Impressions(s) / UC Diagnoses   Final diagnoses:  Body piercing, right clavicle     Discharge Instructions     Contact plastic office tomorrow to schedule office   [address], [place], [place] [address]  [phone]    ED Prescriptions    None     PDMP not reviewed this encounter.   Scot Jun, [place] 03/16/20 [phone]

## 2020-03-07 NOTE — Discharge Instructions (Signed)
Contact plastic office tomorrow to schedule office   [address], [place], [place] [address]  [phone]

## 2020-03-09 ENCOUNTER — Encounter: Payer: Self-pay | Admitting: Plastic Surgery

## 2020-03-09 ENCOUNTER — Ambulatory Visit: Payer: Self-pay | Admitting: Plastic Surgery

## 2020-03-09 ENCOUNTER — Other Ambulatory Visit: Payer: Self-pay

## 2020-03-09 VITALS — HR 109 | Temp 98.6°F | Ht 65.0 in | Wt 184.8 lb

## 2020-03-09 DIAGNOSIS — M795 Residual foreign body in soft tissue: Secondary | ICD-10-CM

## 2020-03-09 NOTE — Progress Notes (Signed)
Referring Provider Marrian Salvage, [place],  [place] [address]   CC:  Chief Complaint  Patient presents with  . Advice Only      Kelly Walsh is an 39 y.o. female.  HPI: Patient presents with an embedded foreign body in her right clavicle.  She got a piercing placed which had a subcutaneous component that screwed into a superficial component.  The subcutaneous component was partially dislodged but the other side remains embedded in in place.  She is been to urgent urgent care and call the number of doctors and no one has been willing to take this out for her.  It causes her pain and she would like it removed.  Allergies  Allergen Reactions  . Bupropion Hives  . Metformin Nausea Only  . Oxycodone-Acetaminophen Itching  . Tramadol Itching  . Codeine   . Oxycodone   . Tape     Adhesive allergy     Outpatient Encounter Medications as of 03/09/2020  Medication Sig  . diclofenac Sodium (VOLTAREN) 1 % GEL Apply 4 g topically 4 (four) times daily. To affected joint.  Marland Kitchen escitalopram (LEXAPRO) 20 MG tablet Take 1 tablet (20 mg total) by mouth daily.  . fluticasone (FLONASE) 50 MCG/ACT nasal spray Place 2 sprays into the nose daily.  Marland Kitchen loratadine (CLARITIN) 10 MG tablet Take 10 mg by mouth daily.  . naproxen sodium (ALEVE) 220 MG tablet Take 220 mg by mouth as needed.  . Probiotic Product (PROBIOTIC DAILY PO) Take by mouth.  . SUMAtriptan (IMITREX) 100 MG tablet Take 1 tablet earliest onset of migraine.  May repeat in 2 hours if headache persists or recurs.  Maximum 2 tablets in 24 hours  . doxycycline (VIBRAMYCIN) 100 MG capsule Take 1 capsule (100 mg total) by mouth 2 (two) times daily. (Patient not taking: Reported on 03/09/2020)   No facility-administered encounter medications on file as of 03/09/2020.     Past Medical History:  Diagnosis Date  . Anemia    H/O  . Hx of migraines   . IBS (irritable bowel syndrome)     Past Surgical History:    Procedure Laterality Date  . FOOT SURGERY Right   . WISDOM TOOTH EXTRACTION      Family History  Problem Relation Age of Onset  . Diabetes Father   . Hypertension Mother   . Lupus Cousin     Social History   Social History Narrative   Right handed, lives in an apartment on 2nd floor alone.   Pt drinks coffee, occasionally hot tea, soda sometimes, water   No children   Does not exercises regularly      Review of Systems General: Denies fevers, chills, weight loss CV: Denies chest pain, shortness of breath, palpitations  Physical Exam Vitals with BMI 03/09/2020 03/07/2020 01/21/2020  Height 5\' 5"  - 5\' 5"   Weight 184 lbs 13 oz - 182 lbs 6 oz  BMI 67.34 - 19.37  Systolic (No Data) 902 409  Diastolic (No Data) 81 73  Pulse 109 82 108    General:  No acute distress,  Alert and oriented, Non-Toxic, Normal speech and affect Examination shows an embedded small metallic piercing in the right clavicle area.  There is no surrounding erythema or drainage.  Assessment/Plan Patient has an embedded piercing that is partially come out in the right clavicle.  I discussed removal.  I discussed the benefit risk and benefits that include bleeding, infection, damage to surrounding  structures and need for revision procedures.  I then cleansed the area with an alcohol pad and injected 3 cc of lidocaine with epinephrine.  A forcep was then used to grab the metallic object and this was able to be pulled free without much trouble.  I did show her the piece and she confirmed that it had been removed in its entirety.  The wound was then covered with ointment and a Band-Aid.  She tolerated this fine.  She can follow-up with [place] as needed.  Cindra Presume 03/09/2020, 4:41 PM

## 2020-03-10 ENCOUNTER — Ambulatory Visit (INDEPENDENT_AMBULATORY_CARE_PROVIDER_SITE_OTHER): Payer: BC Managed Care – PPO | Admitting: Psychology

## 2020-03-10 DIAGNOSIS — F411 Generalized anxiety disorder: Secondary | ICD-10-CM

## 2020-03-10 DIAGNOSIS — F332 Major depressive disorder, recurrent severe without psychotic features: Secondary | ICD-10-CM | POA: Diagnosis not present

## 2020-03-12 ENCOUNTER — Other Ambulatory Visit: Payer: Self-pay | Admitting: Family

## 2020-03-17 ENCOUNTER — Ambulatory Visit: Payer: BC Managed Care – PPO | Admitting: Psychology

## 2020-03-24 ENCOUNTER — Ambulatory Visit (INDEPENDENT_AMBULATORY_CARE_PROVIDER_SITE_OTHER): Payer: BC Managed Care – PPO | Admitting: Psychology

## 2020-03-24 DIAGNOSIS — F411 Generalized anxiety disorder: Secondary | ICD-10-CM | POA: Diagnosis not present

## 2020-03-24 DIAGNOSIS — F332 Major depressive disorder, recurrent severe without psychotic features: Secondary | ICD-10-CM

## 2020-03-30 ENCOUNTER — Encounter: Payer: Self-pay | Admitting: Family

## 2020-03-31 ENCOUNTER — Ambulatory Visit (INDEPENDENT_AMBULATORY_CARE_PROVIDER_SITE_OTHER): Payer: BC Managed Care – PPO | Admitting: Psychology

## 2020-03-31 DIAGNOSIS — F411 Generalized anxiety disorder: Secondary | ICD-10-CM | POA: Diagnosis not present

## 2020-04-01 ENCOUNTER — Other Ambulatory Visit: Payer: Self-pay | Admitting: Family

## 2020-04-01 MED ORDER — ESCITALOPRAM OXALATE 20 MG PO TABS: 20.0000 mg | ORAL_TABLET | Freq: Every day | ORAL | 1 refills | Status: DC

## 2020-04-07 ENCOUNTER — Ambulatory Visit (INDEPENDENT_AMBULATORY_CARE_PROVIDER_SITE_OTHER): Payer: BC Managed Care – PPO | Admitting: Psychology

## 2020-04-07 DIAGNOSIS — F332 Major depressive disorder, recurrent severe without psychotic features: Secondary | ICD-10-CM | POA: Diagnosis not present

## 2020-04-07 DIAGNOSIS — F411 Generalized anxiety disorder: Secondary | ICD-10-CM | POA: Diagnosis not present

## 2020-04-14 ENCOUNTER — Ambulatory Visit (INDEPENDENT_AMBULATORY_CARE_PROVIDER_SITE_OTHER): Payer: BC Managed Care – PPO | Admitting: Psychology

## 2020-04-14 DIAGNOSIS — F332 Major depressive disorder, recurrent severe without psychotic features: Secondary | ICD-10-CM

## 2020-04-14 DIAGNOSIS — F411 Generalized anxiety disorder: Secondary | ICD-10-CM | POA: Diagnosis not present

## 2020-04-18 NOTE — Progress Notes (Deleted)
NEUROLOGY FOLLOW UP OFFICE NOTE  Kelly Walsh [id number]  HISTORY OF PRESENT ILLNESS: Kelly Walsh is a 39 year old woman who follows up for migraine.  UPDATE: ***  Current NSAIDS:  naproxen Current analgesics:  none Current triptans:  Sumatriptan 100mg   Current ergotamine:  none Current anti-emetic:  none Current muscle relaxants:  none Current anti-anxiolytic:  none Current sleep aide:  none Current Antihypertensive medications:  none Current Antidepressant medications:  Nortriptyline 10mg  at bedtime Current Anticonvulsant medications:  none Current anti-CGRP:  none Current Vitamins/Herbal/Supplements:  none Current Antihistamines/Decongestants:  Claritin, Flonase Other therapy:  none  Caffeine:1/2 cup of coffee but not daily. Rarely has a sip of 5 hour energy drink Diet:Hydrates with water. Exercise:Not routine but just started to exercise. Depression:some; Anxiety:yes Other pain:Back pain, right shoulder pain Sleep hygiene:Varies. History of chronic fatigue. She was supposed to have a sleep study but was never able to keep the appointment.  HISTORY: Onset: Since her early 56s. They were fairly well controlled and responded to Aleve until gradually getting worse in 2018 in which they have become more frequent and not responding to medication. Location:bifrontal Quality:sharp Initial intensity:Severe. Shedenies new headache, thunderclap headache Aura:no Premonitory Phase:no Postdrome:no Associated symptoms: Photophobia, phonophobia, osmophobia.Shedenies associated nausea, vomiting, visual disturbance, orunilateral numbness or weakness. Initial duration:Quick onset. Usually 3 to 4 days Initial Frequency:At least once a month Triggers: Unknown Relieving factors: Air conditioning, ice pack, rest in quiet dark room Activity:Cannot function for 3 days  MRI of brain with and without contrast from 01/27/14 was  normal. MRI of brain with and without contrast from 09/07/18 personally reviewed and was normal.   Past NSAIDS:ibuprofen Past analgesics:Excedrin Migraine, Tylenol Past abortive triptans:Maxalt Past abortive ergotamine:none Past muscle relaxants:none Past anti-emetic:Zofran 4mg  Past antihypertensive medications:none Past antidepressant medications:none Past anticonvulsant medications:topiramate 25mg  twice daily(stopped due to UTI, cloudy urine and ineffective) Past anti-CGRP:none Past vitamins/Herbal/Supplements:none Past antihistamines/decongestants:none Other past therapies:none   Family history of headache:No  PAST MEDICAL HISTORY: Past Medical History:  Diagnosis Date  . Anemia    H/O  . Hx of migraines   . IBS (irritable bowel syndrome)     MEDICATIONS: Current Outpatient Medications on File Prior to Visit  Medication Sig Dispense Refill  . diclofenac Sodium (VOLTAREN) 1 % GEL Apply 4 g topically 4 (four) times daily. To affected joint. 100 g 11  . doxycycline (VIBRAMYCIN) 100 MG capsule Take 1 capsule (100 mg total) by mouth 2 (two) times daily. (Patient not taking: Reported on 03/09/2020) 20 capsule 0  . escitalopram (LEXAPRO) 20 MG tablet Take 1 tablet (20 mg total) by mouth daily. 90 tablet 1  . fluticasone (FLONASE) 50 MCG/ACT nasal spray Place 2 sprays into the nose daily.    Marland Kitchen loratadine (CLARITIN) 10 MG tablet Take 10 mg by mouth daily.    . naproxen sodium (ALEVE) 220 MG tablet Take 220 mg by mouth as needed.    . Probiotic Product (PROBIOTIC DAILY PO) Take by mouth.    . SUMAtriptan (IMITREX) 100 MG tablet Take 1 tablet earliest onset of migraine.  May repeat in 2 hours if headache persists or recurs.  Maximum 2 tablets in 24 hours 10 tablet 3   No current facility-administered medications on file prior to visit.    ALLERGIES: Allergies  Allergen Reactions  . Bupropion Hives  . Metformin Nausea Only  .  Oxycodone-Acetaminophen Itching  . Tramadol Itching  . Codeine   . Oxycodone   . Tape     Adhesive  allergy     FAMILY HISTORY: Family History  Problem Relation Age of Onset  . Diabetes Father   . Hypertension Mother   . Lupus Cousin    ***.  SOCIAL HISTORY: Social History   Socioeconomic History  . Marital status: Single    Spouse name: Not on file  . Number of children: 0  . Years of education: Not on file  . Highest education level: Some college, no degree  Occupational History  . Not on file  Tobacco Use  . Smoking status: Never Smoker  . Smokeless tobacco: Never Used  Vaping Use  . Vaping Use: Never used  Substance and Sexual Activity  . Alcohol use: No  . Drug use: No  . Sexual activity: Yes    Birth control/protection: Condom  Other Topics Concern  . Not on file  Social History Narrative   Right handed, lives in an apartment on 2nd floor alone.   Pt drinks coffee, occasionally hot tea, soda sometimes, water   No children   Does not exercises regularly    Social Determinants of Health   Financial Resource Strain:   . Difficulty of Paying Living Expenses: Not on file  Food Insecurity:   . Worried About Charity fundraiser in the Last Year: Not on file  . Ran Out of Food in the Last Year: Not on file  Transportation Needs:   . Lack of Transportation (Medical): Not on file  . Lack of Transportation (Non-Medical): Not on file  Physical Activity:   . Days of Exercise per Week: Not on file  . Minutes of Exercise per Session: Not on file  Stress:   . Feeling of Stress : Not on file  Social Connections:   . Frequency of Communication with Friends and Family: Not on file  . Frequency of Social Gatherings with Friends and Family: Not on file  . Attends Religious Services: Not on file  . Active Member of Clubs or Organizations: Not on file  . Attends Archivist Meetings: Not on file  . Marital Status: Not on file  Intimate Partner Violence:   .  Fear of Current or Ex-Partner: Not on file  . Emotionally Abused: Not on file  . Physically Abused: Not on file  . Sexually Abused: Not on file    REVIEW OF SYSTEMS: Constitutional: No fevers, chills, or sweats, no generalized fatigue, change in appetite Eyes: No visual changes, double vision, eye pain Ear, nose and throat: No hearing loss, ear pain, nasal congestion, sore throat Cardiovascular: No chest pain, palpitations Respiratory:  No shortness of breath at rest or with exertion, wheezes GastrointestinaI: No nausea, vomiting, diarrhea, abdominal pain, fecal incontinence Genitourinary:  No dysuria, urinary retention or frequency Musculoskeletal:  No neck pain, back pain Integumentary: No rash, pruritus, skin lesions Neurological: as above Psychiatric: No depression, insomnia, anxiety Endocrine: No palpitations, fatigue, diaphoresis, mood swings, change in appetite, change in weight, increased thirst Hematologic/Lymphatic:  No purpura, petechiae. Allergic/Immunologic: no itchy/runny eyes, nasal congestion, recent allergic reactions, rashes  PHYSICAL EXAM: *** General: No acute distress.  Patient appears ***-groomed.   Head:  Normocephalic/atraumatic Eyes:  Fundi examined but not visualized Neck: supple, no paraspinal tenderness, full range of motion Heart:  Regular rate and rhythm Lungs:  Clear to auscultation bilaterally Back: No paraspinal tenderness Neurological Exam: alert and oriented to person, place, and time. Attention span and concentration intact, recent and remote memory intact, fund of knowledge intact.  Speech fluent and not dysarthric,  language intact.  CN II-XII intact. Bulk and tone normal, muscle strength 5/5 throughout.  Sensation to light touch, temperature and vibration intact.  Deep tendon reflexes 2+ throughout, toes downgoing.  Finger to nose and heel to shin testing intact.  Gait normal, Romberg negative.  IMPRESSION: ***  PLAN: ***  Metta Clines,  DO  CC: ***

## 2020-04-19 ENCOUNTER — Telehealth: Payer: Self-pay | Admitting: Neurology

## 2020-04-19 ENCOUNTER — Ambulatory Visit: Payer: BC Managed Care – PPO | Admitting: Neurology

## 2020-04-19 NOTE — Telephone Encounter (Signed)
Pt advised of dr. Tomi Likens note

## 2020-04-19 NOTE — Telephone Encounter (Signed)
LMOVM to call back 

## 2020-04-19 NOTE — Telephone Encounter (Signed)
Patient arrived late for her appointment. She is asking if Dr Kelly Walsh would be able to refill Nortriptyline or send in a prescription for a new medication? She states the Sumatriptan doesn't work for her. She needs something she can take during the day because she still has to work. Please call.   Patient uses [facility] on [address].

## 2020-04-19 NOTE — Telephone Encounter (Signed)
When she requested a refill over the summer, we provided the refill until she could be seen in follow up.  She arrived late for follow up.  As it has been a year since her last visit, I can't make any suggestions or refills.  I would recommend that she ask her PCP for a refill until she can be seen in our office.

## 2020-04-21 ENCOUNTER — Ambulatory Visit (INDEPENDENT_AMBULATORY_CARE_PROVIDER_SITE_OTHER): Payer: BC Managed Care – PPO | Admitting: Psychology

## 2020-04-21 DIAGNOSIS — F332 Major depressive disorder, recurrent severe without psychotic features: Secondary | ICD-10-CM

## 2020-04-21 DIAGNOSIS — F411 Generalized anxiety disorder: Secondary | ICD-10-CM

## 2020-04-28 ENCOUNTER — Ambulatory Visit: Payer: BC Managed Care – PPO | Admitting: Neurology

## 2020-04-28 ENCOUNTER — Encounter: Payer: Self-pay | Admitting: Neurology

## 2020-04-28 ENCOUNTER — Ambulatory Visit (INDEPENDENT_AMBULATORY_CARE_PROVIDER_SITE_OTHER): Payer: BC Managed Care – PPO | Admitting: Psychology

## 2020-04-28 ENCOUNTER — Other Ambulatory Visit: Payer: Self-pay

## 2020-04-28 VITALS — BP 112/72 | HR 97 | Ht 65.0 in | Wt 188.0 lb

## 2020-04-28 DIAGNOSIS — F332 Major depressive disorder, recurrent severe without psychotic features: Secondary | ICD-10-CM

## 2020-04-28 DIAGNOSIS — G43009 Migraine without aura, not intractable, without status migrainosus: Secondary | ICD-10-CM

## 2020-04-28 DIAGNOSIS — F411 Generalized anxiety disorder: Secondary | ICD-10-CM

## 2020-04-28 MED ORDER — NORTRIPTYLINE HCL 10 MG PO CAPS: 10.0000 mg | ORAL_CAPSULE | Freq: Every day | ORAL | 5 refills | Status: DC

## 2020-04-28 NOTE — Patient Instructions (Signed)
  1. Start nortriptyline 10mg  at bedtime.  Contact us in 4 weeks with update and we can increase dose if needed. 2. Take Nurtec 75mg  at earliest onset of headache.  May repeat dose once in 2 hours if needed.  Maximum 1 tablet in 24 hours. 3. Limit use of pain relievers to no more than 2 days out of the week.  These medications include acetaminophen, NSAIDs (ibuprofen/Advil/Motrin, naproxen/Aleve, triptans (Imitrex/sumatriptan), Excedrin, and narcotics.  This will help reduce risk of rebound headaches. 4. Be aware of common food triggers:  - Caffeine:  coffee, black tea, cola, Mt. Dew  - Chocolate  - Dairy:  aged cheeses (brie, blue, cheddar, gouda, [place], provolone, [place], Swiss, etc), chocolate milk, buttermilk, sour cream, limit eggs and yogurt  - Nuts, peanut butter  - Alcohol  - Cereals/grains:  FRESH breads (fresh bagels, sourdough, doughnuts), yeast productions  - Processed/canned/aged/cured meats (pre-packaged deli meats, hotdogs)  - MSG/glutamate:  soy sauce, flavor enhancer, pickled/preserved/marinated foods  - Sweeteners:  aspartame (Equal, Nutrasweet).  Sugar and Splenda are okay  - Vegetables:  legumes (lima beans, lentils, snow peas, fava beans, pinto peans, peas, garbanzo beans), sauerkraut, onions, olives, pickles  - Fruit:  avocados, bananas, citrus fruit (orange, lemon, grapefruit), mango  - Other:  Frozen meals, macaroni and cheese 5. Routine exercise 6. Stay adequately hydrated (aim for 64 oz water daily) 7. Keep headache diary 8. Maintain proper stress management 9. Maintain proper sleep hygiene 10. Do not skip meals 11. Consider supplements:  magnesium citrate 400mg  daily, riboflavin 400mg  daily, coenzyme Q10 100mg  three times daily.

## 2020-04-28 NOTE — Progress Notes (Signed)
NEUROLOGY FOLLOW UP OFFICE NOTE  JIMENA WIECZOREK [id number]  HISTORY OF PRESENT ILLNESS: Kelly Walsh is a 39 year old woman who follows up for migraine.  UPDATE: Doesn't think nortriptyline worked, however she was using it as an abortive medication.  Better since starting Lexapro.  They were severe and twice monthly lasting 3 days until she started Lexapro.  Sumatriptan helps but would like something better and doesn't cause the fatigue. Intensity:  severe Duration:  Sumatriptan significantly reduced intensity so functional. Frequency:  1 migraine in past 30 days.  However she has 2 to 3 headache days a week.   She does have other headaches but doesn't pay attention to them because they are not severe and usually related to dehydration, hunger or lack of sleep.    Rescue therapy:  Water and takes sumatriptan. Current NSAIDS:  naproxen Current analgesics:  none Current triptans:  Sumatriptan 100mg   Current ergotamine:  none Current anti-emetic:  none Current muscle relaxants:  none Current anti-anxiolytic:  none Current sleep aide:  none Current Antihypertensive medications:  none Current Antidepressant medications:  Lexapro 20mg  Current Anticonvulsant medications:  none Current anti-CGRP:  none Current Vitamins/Herbal/Supplements:  none Current Antihistamines/Decongestants:  Claritin, Flonase Other therapy:  none  Caffeine:1/2 cup of coffee but not daily. Rarely has a sip of 5 hour energy drink Diet:Hydrates with water. Exercise:No Depression:some; Anxiety:yes.  Work-related.   Other pain:Back pain, right shoulder pain Sleep hygiene:Varies. History of chronic fatigue. She was supposed to have a sleep study but was never able to keep the appointment.  HISTORY: Onset: Since her early 48s. They were fairly well controlled and responded to Aleve until gradually getting worse in 2018 in which they have become more frequent and not responding to  medication. Location:bifrontal Quality:sharp Initial intensity:Severe. Shedenies new headache, thunderclap headache Aura:no Premonitory Phase:no Postdrome:no Associated symptoms: Photophobia, phonophobia, osmophobia.Shedenies associated nausea, vomiting, visual disturbance, orunilateral numbness or weakness. Initial duration:Quick onset. Usually 3 to 4 days Initial Frequency:At least once a month Triggers: dehydration, hunger, lack of sleep, stress Relieving factors: Air conditioning, ice pack, rest in quiet dark room Activity:Cannot function for 3 days  MRI of brain with and without contrast from 01/27/14 was normal. MRI of brain with and without contrast from 09/07/18 personally reviewed and was normal.  Past NSAIDS:ibuprofen Past analgesics:Excedrin Migraine, Tylenol Past abortive triptans:Maxalt Past abortive ergotamine:none Past muscle relaxants:none Past anti-emetic:Zofran 4mg  Past antihypertensive medications:none Past antidepressant medications:nortriptyline Past anticonvulsant medications:topiramate 25mg  twice daily(stopped due to UTI, cloudy urine and ineffective) Past anti-CGRP:none Past vitamins/Herbal/Supplements:none Past antihistamines/decongestants:none Other past therapies:none   Family history of headache:No   PAST MEDICAL HISTORY: Past Medical History:  Diagnosis Date  . Anemia    H/O  . Hx of migraines   . IBS (irritable bowel syndrome)     MEDICATIONS: Current Outpatient Medications on File Prior to Visit  Medication Sig Dispense Refill  . diclofenac Sodium (VOLTAREN) 1 % GEL Apply 4 g topically 4 (four) times daily. To affected joint. 100 g 11  . doxycycline (VIBRAMYCIN) 100 MG capsule Take 1 capsule (100 mg total) by mouth 2 (two) times daily. (Patient not taking: Reported on 03/09/2020) 20 capsule 0  . escitalopram (LEXAPRO) 20 MG tablet Take 1 tablet (20 mg total) by mouth daily. 90 tablet 1   . fluticasone (FLONASE) 50 MCG/ACT nasal spray Place 2 sprays into the nose daily.    Marland Kitchen loratadine (CLARITIN) 10 MG tablet Take 10 mg by mouth daily.    . naproxen sodium (  ALEVE) 220 MG tablet Take 220 mg by mouth as needed.    . Probiotic Product (PROBIOTIC DAILY PO) Take by mouth.    . SUMAtriptan (IMITREX) 100 MG tablet Take 1 tablet earliest onset of migraine.  May repeat in 2 hours if headache persists or recurs.  Maximum 2 tablets in 24 hours 10 tablet 3   No current facility-administered medications on file prior to visit.    ALLERGIES: Allergies  Allergen Reactions  . Bupropion Hives  . Metformin Nausea Only  . Oxycodone-Acetaminophen Itching  . Tramadol Itching  . Codeine   . Oxycodone   . Tape     Adhesive allergy     FAMILY HISTORY: Family History  Problem Relation Age of Onset  . Diabetes Father   . Hypertension Mother   . Lupus Cousin     SOCIAL HISTORY: Social History   Socioeconomic History  . Marital status: Single    Spouse name: Not on file  . Number of children: 0  . Years of education: Not on file  . Highest education level: Some college, no degree  Occupational History  . Not on file  Tobacco Use  . Smoking status: Never Smoker  . Smokeless tobacco: Never Used  Vaping Use  . Vaping Use: Never used  Substance and Sexual Activity  . Alcohol use: No  . Drug use: No  . Sexual activity: Yes    Birth control/protection: Condom  Other Topics Concern  . Not on file  Social History Narrative   Right handed, lives in an apartment on 2nd floor alone.   Pt drinks coffee, occasionally hot tea, soda sometimes, water   No children   Does not exercises regularly    Social Determinants of Health   Financial Resource Strain:   . Difficulty of Paying Living Expenses: Not on file  Food Insecurity:   . Worried About Charity fundraiser in the Last Year: Not on file  . Ran Out of Food in the Last Year: Not on file  Transportation Needs:   . Lack of  Transportation (Medical): Not on file  . Lack of Transportation (Non-Medical): Not on file  Physical Activity:   . Days of Exercise per Week: Not on file  . Minutes of Exercise per Session: Not on file  Stress:   . Feeling of Stress : Not on file  Social Connections:   . Frequency of Communication with Friends and Family: Not on file  . Frequency of Social Gatherings with Friends and Family: Not on file  . Attends Religious Services: Not on file  . Active Member of Clubs or Organizations: Not on file  . Attends Archivist Meetings: Not on file  . Marital Status: Not on file  Intimate Partner Violence:   . Fear of Current or Ex-Partner: Not on file  . Emotionally Abused: Not on file  . Physically Abused: Not on file  . Sexually Abused: Not on file    PHYSICAL EXAM: Blood pressure 112/72, pulse 97, height 5\' 5"  (1.651 m), weight 188 lb (85.3 kg), SpO2 97 %. General: No acute distress.  Patient appears well-groomed.     IMPRESSION: Migraine without Aura.  Reports severe migraines improved but still averages 2-3 headaches a week.  PLAN: 1. Migraine preventative: She will start nortriptyline 10mg  every night at bedtime.  If headache frequency not reduced in 6 weeks, she will contact me and we can increase dose to 25mg  at bedtime 2.  Migraine rescue:  Will try Ubrelvy 100mg .  Samples provided.  Will contact us if she wants a prescription. 3.  Limit use of pain relievers to no more than 2 days out of week to prevent risk of rebound or medication-overuse headache. 4.  Keep headache diary 5.  Follow up 4 to 6 months.  Metta Clines, DO  CC: Marrian Salvage, [place]

## 2020-05-05 ENCOUNTER — Ambulatory Visit (INDEPENDENT_AMBULATORY_CARE_PROVIDER_SITE_OTHER): Payer: BC Managed Care – PPO | Admitting: Psychology

## 2020-05-05 DIAGNOSIS — F332 Major depressive disorder, recurrent severe without psychotic features: Secondary | ICD-10-CM | POA: Diagnosis not present

## 2020-05-05 DIAGNOSIS — F411 Generalized anxiety disorder: Secondary | ICD-10-CM

## 2020-05-12 ENCOUNTER — Ambulatory Visit: Payer: BC Managed Care – PPO | Admitting: Psychology

## 2020-05-19 ENCOUNTER — Ambulatory Visit (INDEPENDENT_AMBULATORY_CARE_PROVIDER_SITE_OTHER): Payer: BC Managed Care – PPO | Admitting: Psychology

## 2020-05-19 DIAGNOSIS — F332 Major depressive disorder, recurrent severe without psychotic features: Secondary | ICD-10-CM | POA: Diagnosis not present

## 2020-05-19 DIAGNOSIS — F411 Generalized anxiety disorder: Secondary | ICD-10-CM

## 2020-05-26 ENCOUNTER — Ambulatory Visit: Payer: BC Managed Care – PPO | Admitting: Psychology

## 2020-05-30 ENCOUNTER — Other Ambulatory Visit: Payer: Self-pay | Admitting: Neurology

## 2020-05-30 ENCOUNTER — Encounter: Payer: Self-pay | Admitting: Neurology

## 2020-05-30 MED ORDER — UBRELVY 100 MG PO TABS: 100.0000 mg | ORAL_TABLET | ORAL | 5 refills | Status: AC | PRN

## 2020-05-30 NOTE — Progress Notes (Signed)
Kelly Walsh (Key: [id number]) Kelly Walsh 100MG  tablets   Form Blue Building control surveyor Form (CB) Created 4 minutes ago Sent to Plan 2 minutes ago Plan Response 2 minutes ago Submit Clinical Questions less than a minute ago Determination Favorable less than a minute ago Message from Plan Effective from 05/30/2020 through 08/21/2020.

## 2020-06-02 ENCOUNTER — Ambulatory Visit: Payer: BC Managed Care – PPO | Admitting: Psychology

## 2020-06-09 ENCOUNTER — Ambulatory Visit: Payer: BC Managed Care – PPO | Admitting: Psychology

## 2020-06-16 ENCOUNTER — Ambulatory Visit: Payer: BC Managed Care – PPO | Admitting: Psychology

## 2020-06-23 ENCOUNTER — Ambulatory Visit: Payer: BC Managed Care – PPO | Admitting: Psychology

## 2020-06-30 ENCOUNTER — Ambulatory Visit: Payer: BC Managed Care – PPO | Admitting: Psychology

## 2020-07-07 ENCOUNTER — Ambulatory Visit (INDEPENDENT_AMBULATORY_CARE_PROVIDER_SITE_OTHER): Payer: BC Managed Care – PPO | Admitting: Psychology

## 2020-07-07 DIAGNOSIS — F411 Generalized anxiety disorder: Secondary | ICD-10-CM

## 2020-07-07 DIAGNOSIS — F332 Major depressive disorder, recurrent severe without psychotic features: Secondary | ICD-10-CM

## 2020-07-14 ENCOUNTER — Ambulatory Visit (INDEPENDENT_AMBULATORY_CARE_PROVIDER_SITE_OTHER): Payer: BC Managed Care – PPO | Admitting: Psychology

## 2020-07-14 DIAGNOSIS — F332 Major depressive disorder, recurrent severe without psychotic features: Secondary | ICD-10-CM

## 2020-07-14 DIAGNOSIS — F411 Generalized anxiety disorder: Secondary | ICD-10-CM

## 2020-07-21 ENCOUNTER — Ambulatory Visit (INDEPENDENT_AMBULATORY_CARE_PROVIDER_SITE_OTHER): Payer: BC Managed Care – PPO | Admitting: Psychology

## 2020-07-21 DIAGNOSIS — F332 Major depressive disorder, recurrent severe without psychotic features: Secondary | ICD-10-CM | POA: Diagnosis not present

## 2020-07-21 DIAGNOSIS — F411 Generalized anxiety disorder: Secondary | ICD-10-CM

## 2020-07-26 DIAGNOSIS — H40023 Open angle with borderline findings, high risk, bilateral: Secondary | ICD-10-CM | POA: Diagnosis not present

## 2020-07-26 DIAGNOSIS — Z01419 Encounter for gynecological examination (general) (routine) without abnormal findings: Secondary | ICD-10-CM | POA: Diagnosis not present

## 2020-07-26 DIAGNOSIS — N920 Excessive and frequent menstruation with regular cycle: Secondary | ICD-10-CM | POA: Diagnosis not present

## 2020-07-26 DIAGNOSIS — Z1389 Encounter for screening for other disorder: Secondary | ICD-10-CM | POA: Diagnosis not present

## 2020-07-26 DIAGNOSIS — Z1231 Encounter for screening mammogram for malignant neoplasm of breast: Secondary | ICD-10-CM | POA: Diagnosis not present

## 2020-07-26 DIAGNOSIS — Z130 Encounter for screening for diseases of the blood and blood-forming organs and certain disorders involving the immune mechanism: Secondary | ICD-10-CM | POA: Diagnosis not present

## 2020-07-26 DIAGNOSIS — Z6832 Body mass index (BMI) 32.0-32.9, adult: Secondary | ICD-10-CM | POA: Diagnosis not present

## 2020-07-26 DIAGNOSIS — Z202 Contact with and (suspected) exposure to infections with a predominantly sexual mode of transmission: Secondary | ICD-10-CM | POA: Diagnosis not present

## 2020-07-26 DIAGNOSIS — H04123 Dry eye syndrome of bilateral lacrimal glands: Secondary | ICD-10-CM | POA: Diagnosis not present

## 2020-07-28 ENCOUNTER — Ambulatory Visit (INDEPENDENT_AMBULATORY_CARE_PROVIDER_SITE_OTHER): Payer: BC Managed Care – PPO | Admitting: Psychology

## 2020-07-28 DIAGNOSIS — F332 Major depressive disorder, recurrent severe without psychotic features: Secondary | ICD-10-CM

## 2020-07-28 DIAGNOSIS — F411 Generalized anxiety disorder: Secondary | ICD-10-CM

## 2020-08-04 ENCOUNTER — Ambulatory Visit (INDEPENDENT_AMBULATORY_CARE_PROVIDER_SITE_OTHER): Payer: BC Managed Care – PPO | Admitting: Psychology

## 2020-08-04 DIAGNOSIS — F332 Major depressive disorder, recurrent severe without psychotic features: Secondary | ICD-10-CM

## 2020-08-04 DIAGNOSIS — F411 Generalized anxiety disorder: Secondary | ICD-10-CM | POA: Diagnosis not present

## 2020-08-10 ENCOUNTER — Encounter: Payer: Self-pay | Admitting: Neurology

## 2020-08-10 NOTE — Progress Notes (Signed)
Kindsey Leavens (Key: [id number]) Roselyn Meier 100MG  tablets   Form Blue Building control surveyor Form (CB) Created 1 day ago Sent to Plan 1 day ago Plan Response 1 day ago Submit Clinical Questions 1 day ago Determination Favorable 1 hour ago Message from Plan Effective from 08/09/2020 through 08/08/2021.

## 2020-08-11 ENCOUNTER — Ambulatory Visit (INDEPENDENT_AMBULATORY_CARE_PROVIDER_SITE_OTHER): Payer: BC Managed Care – PPO | Admitting: Psychology

## 2020-08-11 DIAGNOSIS — F411 Generalized anxiety disorder: Secondary | ICD-10-CM

## 2020-08-11 DIAGNOSIS — F332 Major depressive disorder, recurrent severe without psychotic features: Secondary | ICD-10-CM

## 2020-08-18 ENCOUNTER — Ambulatory Visit: Payer: BC Managed Care – PPO | Admitting: Psychology

## 2020-08-18 DIAGNOSIS — D259 Leiomyoma of uterus, unspecified: Secondary | ICD-10-CM | POA: Diagnosis not present

## 2020-08-18 DIAGNOSIS — N924 Excessive bleeding in the premenopausal period: Secondary | ICD-10-CM | POA: Diagnosis not present

## 2020-08-25 ENCOUNTER — Ambulatory Visit (INDEPENDENT_AMBULATORY_CARE_PROVIDER_SITE_OTHER): Payer: BC Managed Care – PPO | Admitting: Psychology

## 2020-08-25 DIAGNOSIS — F411 Generalized anxiety disorder: Secondary | ICD-10-CM

## 2020-08-25 DIAGNOSIS — F332 Major depressive disorder, recurrent severe without psychotic features: Secondary | ICD-10-CM | POA: Diagnosis not present

## 2020-09-01 ENCOUNTER — Ambulatory Visit (INDEPENDENT_AMBULATORY_CARE_PROVIDER_SITE_OTHER): Payer: BC Managed Care – PPO | Admitting: Psychology

## 2020-09-01 DIAGNOSIS — F411 Generalized anxiety disorder: Secondary | ICD-10-CM

## 2020-09-08 ENCOUNTER — Ambulatory Visit: Payer: BC Managed Care – PPO | Admitting: Psychology

## 2020-09-15 ENCOUNTER — Ambulatory Visit (INDEPENDENT_AMBULATORY_CARE_PROVIDER_SITE_OTHER): Payer: BC Managed Care – PPO | Admitting: Psychology

## 2020-09-15 DIAGNOSIS — F332 Major depressive disorder, recurrent severe without psychotic features: Secondary | ICD-10-CM

## 2020-09-15 DIAGNOSIS — F411 Generalized anxiety disorder: Secondary | ICD-10-CM | POA: Diagnosis not present

## 2020-09-22 ENCOUNTER — Ambulatory Visit (INDEPENDENT_AMBULATORY_CARE_PROVIDER_SITE_OTHER): Payer: BC Managed Care – PPO | Admitting: Psychology

## 2020-09-22 DIAGNOSIS — F332 Major depressive disorder, recurrent severe without psychotic features: Secondary | ICD-10-CM | POA: Diagnosis not present

## 2020-09-22 DIAGNOSIS — F411 Generalized anxiety disorder: Secondary | ICD-10-CM

## 2020-09-29 ENCOUNTER — Ambulatory Visit (INDEPENDENT_AMBULATORY_CARE_PROVIDER_SITE_OTHER): Payer: BC Managed Care – PPO | Admitting: Psychology

## 2020-09-29 DIAGNOSIS — F411 Generalized anxiety disorder: Secondary | ICD-10-CM

## 2020-09-29 DIAGNOSIS — F332 Major depressive disorder, recurrent severe without psychotic features: Secondary | ICD-10-CM

## 2020-10-04 ENCOUNTER — Ambulatory Visit: Payer: BC Managed Care – PPO | Admitting: Neurology

## 2020-10-06 ENCOUNTER — Ambulatory Visit (INDEPENDENT_AMBULATORY_CARE_PROVIDER_SITE_OTHER): Payer: BC Managed Care – PPO | Admitting: Psychology

## 2020-10-06 DIAGNOSIS — F411 Generalized anxiety disorder: Secondary | ICD-10-CM

## 2020-10-06 DIAGNOSIS — F332 Major depressive disorder, recurrent severe without psychotic features: Secondary | ICD-10-CM

## 2020-10-13 ENCOUNTER — Ambulatory Visit (INDEPENDENT_AMBULATORY_CARE_PROVIDER_SITE_OTHER): Payer: BC Managed Care – PPO | Admitting: Psychology

## 2020-10-13 DIAGNOSIS — F332 Major depressive disorder, recurrent severe without psychotic features: Secondary | ICD-10-CM

## 2020-10-13 DIAGNOSIS — F411 Generalized anxiety disorder: Secondary | ICD-10-CM | POA: Diagnosis not present

## 2020-10-20 ENCOUNTER — Ambulatory Visit (INDEPENDENT_AMBULATORY_CARE_PROVIDER_SITE_OTHER): Payer: BC Managed Care – PPO | Admitting: Psychology

## 2020-10-20 DIAGNOSIS — F411 Generalized anxiety disorder: Secondary | ICD-10-CM | POA: Diagnosis not present

## 2020-10-20 DIAGNOSIS — F332 Major depressive disorder, recurrent severe without psychotic features: Secondary | ICD-10-CM | POA: Diagnosis not present

## 2020-11-03 ENCOUNTER — Ambulatory Visit (INDEPENDENT_AMBULATORY_CARE_PROVIDER_SITE_OTHER): Payer: BC Managed Care – PPO | Admitting: Psychology

## 2020-11-03 DIAGNOSIS — F411 Generalized anxiety disorder: Secondary | ICD-10-CM

## 2020-11-10 ENCOUNTER — Ambulatory Visit (INDEPENDENT_AMBULATORY_CARE_PROVIDER_SITE_OTHER): Payer: BC Managed Care – PPO | Admitting: Psychology

## 2020-11-10 DIAGNOSIS — F332 Major depressive disorder, recurrent severe without psychotic features: Secondary | ICD-10-CM

## 2020-11-10 DIAGNOSIS — F411 Generalized anxiety disorder: Secondary | ICD-10-CM

## 2020-11-15 ENCOUNTER — Ambulatory Visit: Payer: BC Managed Care – PPO | Admitting: Neurology

## 2020-11-23 ENCOUNTER — Ambulatory Visit (INDEPENDENT_AMBULATORY_CARE_PROVIDER_SITE_OTHER): Payer: BC Managed Care – PPO | Admitting: Psychology

## 2020-11-23 DIAGNOSIS — F411 Generalized anxiety disorder: Secondary | ICD-10-CM | POA: Diagnosis not present

## 2020-11-23 DIAGNOSIS — F332 Major depressive disorder, recurrent severe without psychotic features: Secondary | ICD-10-CM

## 2020-11-30 ENCOUNTER — Ambulatory Visit (INDEPENDENT_AMBULATORY_CARE_PROVIDER_SITE_OTHER): Payer: BC Managed Care – PPO | Admitting: Psychology

## 2020-11-30 DIAGNOSIS — F411 Generalized anxiety disorder: Secondary | ICD-10-CM

## 2020-11-30 DIAGNOSIS — F331 Major depressive disorder, recurrent, moderate: Secondary | ICD-10-CM | POA: Diagnosis not present

## 2020-12-01 ENCOUNTER — Ambulatory Visit: Payer: BC Managed Care – PPO | Admitting: Psychology

## 2020-12-05 ENCOUNTER — Ambulatory Visit: Payer: BC Managed Care – PPO | Admitting: Psychology

## 2020-12-08 ENCOUNTER — Ambulatory Visit (INDEPENDENT_AMBULATORY_CARE_PROVIDER_SITE_OTHER): Payer: BC Managed Care – PPO | Admitting: Psychology

## 2020-12-08 DIAGNOSIS — F332 Major depressive disorder, recurrent severe without psychotic features: Secondary | ICD-10-CM

## 2020-12-08 DIAGNOSIS — F411 Generalized anxiety disorder: Secondary | ICD-10-CM | POA: Diagnosis not present

## 2020-12-15 ENCOUNTER — Ambulatory Visit (INDEPENDENT_AMBULATORY_CARE_PROVIDER_SITE_OTHER): Payer: BC Managed Care – PPO | Admitting: Psychology

## 2020-12-15 DIAGNOSIS — F332 Major depressive disorder, recurrent severe without psychotic features: Secondary | ICD-10-CM | POA: Diagnosis not present

## 2020-12-15 DIAGNOSIS — F411 Generalized anxiety disorder: Secondary | ICD-10-CM | POA: Diagnosis not present

## 2020-12-18 ENCOUNTER — Other Ambulatory Visit: Payer: Self-pay | Admitting: Neurology

## 2020-12-20 ENCOUNTER — Ambulatory Visit: Payer: BC Managed Care – PPO | Admitting: Psychology

## 2020-12-20 ENCOUNTER — Encounter: Payer: Self-pay | Admitting: Family

## 2020-12-22 ENCOUNTER — Ambulatory Visit (INDEPENDENT_AMBULATORY_CARE_PROVIDER_SITE_OTHER): Payer: BC Managed Care – PPO | Admitting: Psychology

## 2020-12-22 DIAGNOSIS — F411 Generalized anxiety disorder: Secondary | ICD-10-CM

## 2020-12-22 DIAGNOSIS — F332 Major depressive disorder, recurrent severe without psychotic features: Secondary | ICD-10-CM

## 2020-12-28 ENCOUNTER — Other Ambulatory Visit: Payer: Self-pay | Admitting: Family

## 2020-12-28 ENCOUNTER — Telehealth: Payer: Self-pay | Admitting: Family

## 2020-12-28 NOTE — Telephone Encounter (Signed)
I did let her know that I was no longer at [facility] but she did not respond regarding follow up. I have not seen her since 08/2019 so we cannot continue to refill the Lexapro without an OV. I will do one more refill for her and she either needs to see me or new PCP.

## 2020-12-29 ENCOUNTER — Ambulatory Visit (INDEPENDENT_AMBULATORY_CARE_PROVIDER_SITE_OTHER): Payer: BC Managed Care – PPO | Admitting: Psychology

## 2020-12-29 DIAGNOSIS — F902 Attention-deficit hyperactivity disorder, combined type: Secondary | ICD-10-CM | POA: Diagnosis not present

## 2020-12-29 DIAGNOSIS — F332 Major depressive disorder, recurrent severe without psychotic features: Secondary | ICD-10-CM | POA: Diagnosis not present

## 2020-12-29 DIAGNOSIS — F411 Generalized anxiety disorder: Secondary | ICD-10-CM

## 2020-12-29 NOTE — Telephone Encounter (Signed)
Left detailed message about note below.

## 2021-01-05 ENCOUNTER — Ambulatory Visit (INDEPENDENT_AMBULATORY_CARE_PROVIDER_SITE_OTHER): Payer: BC Managed Care – PPO | Admitting: Psychology

## 2021-01-05 DIAGNOSIS — F902 Attention-deficit hyperactivity disorder, combined type: Secondary | ICD-10-CM

## 2021-01-05 DIAGNOSIS — F411 Generalized anxiety disorder: Secondary | ICD-10-CM | POA: Diagnosis not present

## 2021-01-10 ENCOUNTER — Encounter: Payer: Self-pay | Admitting: Family

## 2021-01-12 ENCOUNTER — Encounter: Payer: Self-pay | Admitting: Family

## 2021-01-12 ENCOUNTER — Ambulatory Visit (INDEPENDENT_AMBULATORY_CARE_PROVIDER_SITE_OTHER): Payer: BC Managed Care – PPO | Admitting: Psychology

## 2021-01-12 DIAGNOSIS — F332 Major depressive disorder, recurrent severe without psychotic features: Secondary | ICD-10-CM

## 2021-01-12 DIAGNOSIS — F411 Generalized anxiety disorder: Secondary | ICD-10-CM | POA: Diagnosis not present

## 2021-01-12 DIAGNOSIS — F902 Attention-deficit hyperactivity disorder, combined type: Secondary | ICD-10-CM

## 2021-01-19 ENCOUNTER — Ambulatory Visit (INDEPENDENT_AMBULATORY_CARE_PROVIDER_SITE_OTHER): Payer: BC Managed Care – PPO | Admitting: Psychology

## 2021-01-19 DIAGNOSIS — F902 Attention-deficit hyperactivity disorder, combined type: Secondary | ICD-10-CM | POA: Diagnosis not present

## 2021-01-19 DIAGNOSIS — F332 Major depressive disorder, recurrent severe without psychotic features: Secondary | ICD-10-CM | POA: Diagnosis not present

## 2021-01-19 DIAGNOSIS — F411 Generalized anxiety disorder: Secondary | ICD-10-CM

## 2021-01-20 ENCOUNTER — Ambulatory Visit: Payer: BC Managed Care – PPO | Admitting: Family

## 2021-01-20 ENCOUNTER — Other Ambulatory Visit: Payer: Self-pay

## 2021-01-20 ENCOUNTER — Encounter: Payer: Self-pay | Admitting: Family

## 2021-01-20 VITALS — BP 112/70 | HR 101 | Temp 99.1°F | Ht 65.0 in | Wt 194.2 lb

## 2021-01-20 DIAGNOSIS — F419 Anxiety disorder, unspecified: Secondary | ICD-10-CM | POA: Diagnosis not present

## 2021-01-20 DIAGNOSIS — Z79899 Other long term (current) drug therapy: Secondary | ICD-10-CM

## 2021-01-20 DIAGNOSIS — F988 Other specified behavioral and emotional disorders with onset usually occurring in childhood and adolescence: Secondary | ICD-10-CM

## 2021-01-20 MED ORDER — AMPHETAMINE-DEXTROAMPHETAMINE 20 MG PO TABS: 20.0000 mg | ORAL_TABLET | Freq: Two times a day (BID) | ORAL | 0 refills | Status: DC

## 2021-01-20 MED ORDER — ESCITALOPRAM OXALATE 20 MG PO TABS: 20.0000 mg | ORAL_TABLET | Freq: Every day | ORAL | 0 refills | Status: DC

## 2021-01-20 NOTE — Progress Notes (Signed)
Kelly Walsh is a 40 y.o. female with the following history as recorded in Epic[employer]:  Patient Active Problem List   Diagnosis Date Noted   Migraine headache 08/25/2018   Fibroids 08/25/2018   Allergic rhinitis 08/25/2018   Complex regional pain syndrome of lower limb 10/07/2017    Current Outpatient Medications  Medication Sig Dispense Refill   amphetamine-dextroamphetamine (ADDERALL) 20 MG tablet Take 1 tablet (20 mg total) by mouth 2 (two) times daily. 60 tablet 0   diclofenac Sodium (VOLTAREN) 1 % GEL Apply 4 g topically 4 (four) times daily. To affected joint. 100 g 11   fluticasone (FLONASE) 50 MCG/ACT nasal spray Place 2 sprays into the nose daily.     loratadine (CLARITIN) 10 MG tablet Take 10 mg by mouth daily.     naproxen sodium (ALEVE) 220 MG tablet Take 220 mg by mouth as needed.     nortriptyline (PAMELOR) 10 MG capsule TAKE 1 CAPSULE BY MOUTH AT BEDTIME. 90 capsule 1   Probiotic Product (PROBIOTIC DAILY PO) Take by mouth.     Ubrogepant (UBRELVY) 100 MG TABS Take 100 mg by mouth as needed (May repeat dose in 2 hours.  Maximum 2 tablets in 24 hours.). 10 tablet 5   escitalopram (LEXAPRO) 20 MG tablet Take 1 tablet (20 mg total) by mouth daily. 90 tablet 0   SUMAtriptan (IMITREX) 100 MG tablet Take 1 tablet earliest onset of migraine.  May repeat in 2 hours if headache persists or recurs.  Maximum 2 tablets in 24 hours (Patient not taking: Reported on 01/20/2021) 10 tablet 3   No current facility-administered medications for this visit.    Allergies: Bupropion, Metformin, Oxycodone-acetaminophen, Tramadol, Codeine, Oxycodone, and Tape  Past Medical History:  Diagnosis Date   Anemia    H/O   Hx of migraines    IBS (irritable bowel syndrome)     Past Surgical History:  Procedure Laterality Date   FOOT SURGERY Right    WISDOM TOOTH EXTRACTION      Family History  Problem Relation Age of Onset   Diabetes Father    Hypertension Mother    Lupus Cousin     Social  History   Tobacco Use   Smoking status: Never   Smokeless tobacco: Never  Substance Use Topics   Alcohol use: No    Subjective:   Follow up to review/ discuss recent ADD testing; would like to discuss medication options; doing well on Lexapro; admits there is an underlying depression but recently lost her job unexpectedly and therapist feels depression being aggravated by untreated ADD;    Objective:  Vitals:   01/20/21 1113  BP: 112/70  Pulse: (!) 101  Temp: 99.1 F (37.3 C)  TempSrc: Oral  SpO2: 98%  Weight: 194 lb 3.2 oz (88.1 kg)  Height: '5\' 5"'$  (1.651 m)    General: Well developed, well nourished, in no acute distress  Skin : Warm and dry.  Head: Normocephalic and atraumatic  Eyes: Sclera and conjunctiva clear; pupils round and reactive to light; extraocular movements intact  Ears: External normal; canals clear; tympanic membranes normal  Oropharynx: Pink, supple. No suspicious lesions  Neck: Supple without thyromegaly, adenopathy  Lungs: Respirations unlabored;  Neurologic: Alert and oriented; speech intact; face symmetrical; moves all extremities well; CNII-XII intact without focal deficit   Assessment:  1. Anxiety   2. High risk medication use   3. Attention deficit disorder, unspecified hyperactivity presence     Plan:   Refill on  Lexapro 20 mg daily; Update EKG today- sinus rhythm; trial of Adderall 20 mg- risks/ benefits discussed; will start with once per day and consider taking bid; she will follow up with response after 1-2 weeks;  Time spent 30 minutes coordinating care/ discussing medication options No follow-ups on file.  Orders Placed This Encounter  Procedures   EKG 12-Lead    Requested Prescriptions   Signed Prescriptions Disp Refills   amphetamine-dextroamphetamine (ADDERALL) 20 MG tablet 60 tablet 0    Sig: Take 1 tablet (20 mg total) by mouth 2 (two) times daily.   escitalopram (LEXAPRO) 20 MG tablet 90 tablet 0    Sig: Take 1 tablet (20  mg total) by mouth daily.

## 2021-01-26 ENCOUNTER — Ambulatory Visit (INDEPENDENT_AMBULATORY_CARE_PROVIDER_SITE_OTHER): Payer: BC Managed Care – PPO | Admitting: Psychology

## 2021-01-26 DIAGNOSIS — F902 Attention-deficit hyperactivity disorder, combined type: Secondary | ICD-10-CM

## 2021-01-26 DIAGNOSIS — F332 Major depressive disorder, recurrent severe without psychotic features: Secondary | ICD-10-CM

## 2021-01-26 DIAGNOSIS — F411 Generalized anxiety disorder: Secondary | ICD-10-CM | POA: Diagnosis not present

## 2021-01-29 IMAGING — MR MR HEAD WO/W CM
12 series · 48 of 48 positions shown · IV contrast (multihance)
Comparison: Head CT 09/15/2012

CLINICAL DATA: Chronic migraines that are worsening

EXAM:
MRI HEAD WITHOUT AND WITH CONTRAST
TECHNIQUE: Multiplanar, multiecho pulse sequences of the brain and surrounding
structures were obtained without and with intravenous contrast.
CONTRAST:  15mL MULTIHANCE GADOBENATE DIMEGLUMINE 529 MG/ML IV SOLN

[Series 2: T1 · sagittal · 5.0mm · 0.45mm/px · 1 of 21 slices shown]
[im 1/21]
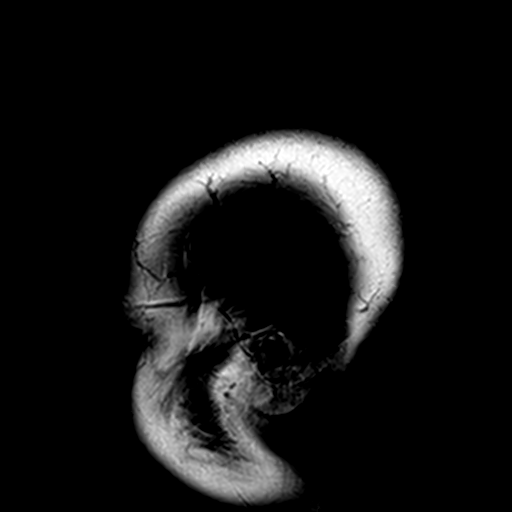

[Series 3: DWI · axial · 3.0mm · 1.80mm/px · z∈[-48,+97]mm · 7 of 99 slices shown (1 of 4)]
[im 1/99]
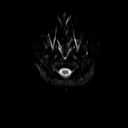
[im 17/99]
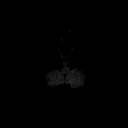
[im 33/99]
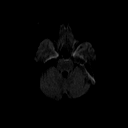
[im 50/99]
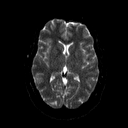
[im 66/99]
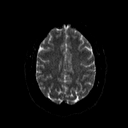
[im 82/99]
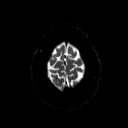
[im 99/99]
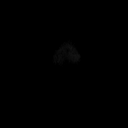

[Series 4: DWI · axial · 3.0mm · 1.80mm/px · z∈[-48,+97]mm · 3 of 50 slices shown (2 of 4)]
[im 1/50]
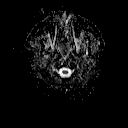
[im 25/50]
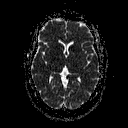
[im 50/50]
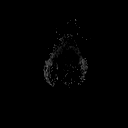

[Series 5: DWI · coronal · 5.0mm · 1.80mm/px · 5 of 68 slices shown (3 of 4)]
[im 1/68]
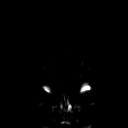
[im 17/68]
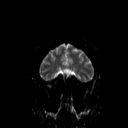
[im 34/68]
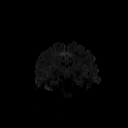
[im 51/68]
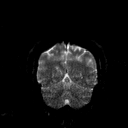
[im 68/68]
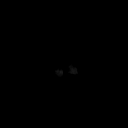

[Series 6: DWI · coronal · 5.0mm · 1.80mm/px · 2 of 34 slices shown (4 of 4)]
[im 1/34]
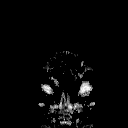
[im 34/34]
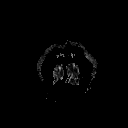

[Series 7: T2 · axial · 5.0mm · 0.51mm/px · z∈[-51,+87]mm · 2 of 22 slices shown (1 of 2)]
[im 1/22]
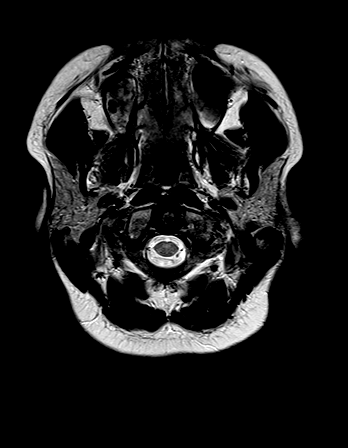
[im 22/22]
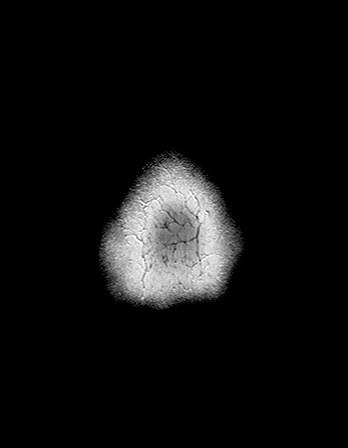

[Series 8: FLAIR · axial · 3.0mm · 0.45mm/px · z∈[-38,+94]mm · 2 of 30 slices shown]
[im 1/30]
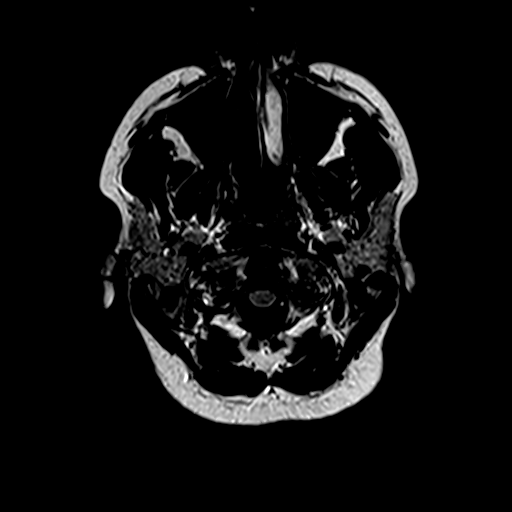
[im 30/30]
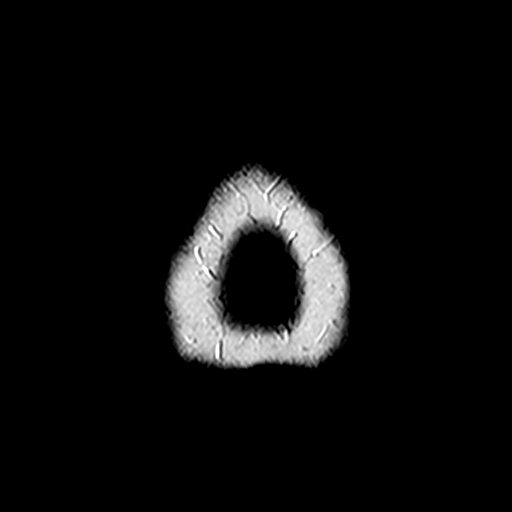

[Series 10: swi_images · axial · 4.0mm · 0.90mm/px · z∈[-48,+89]mm · 2 of 36 slices shown]
[im 1/36]
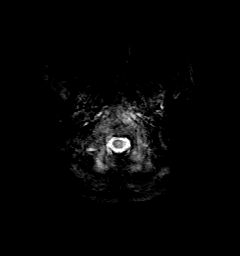
[im 36/36]
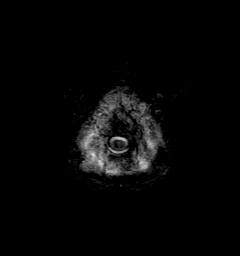

[Series 11: t1_mpr_tra · axial · 1.0mm · 0.75mm/px · z∈[-63,+74]mm · 10 of 144 slices shown (1 of 2)]
[im 1/144]
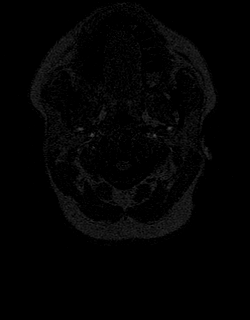
[im 16/144]
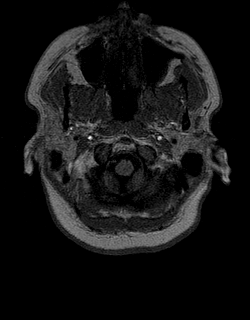
[im 32/144]
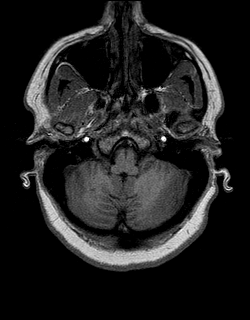
[im 48/144]
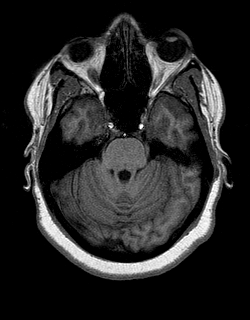
[im 64/144]
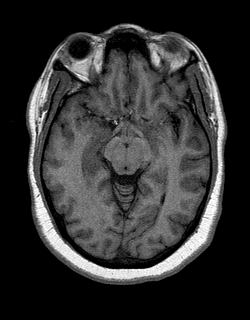
[im 80/144]
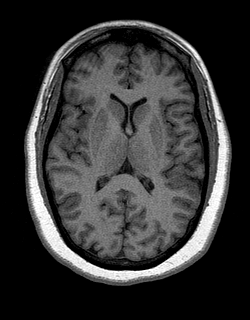
[im 96/144]
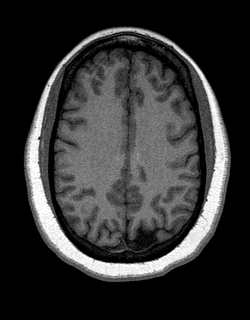
[im 112/144]
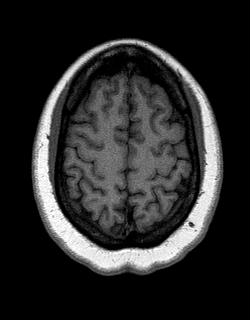
[im 128/144]
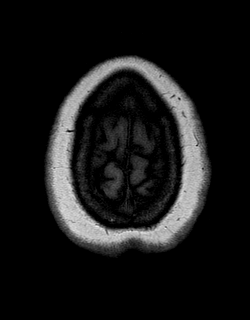
[im 144/144]
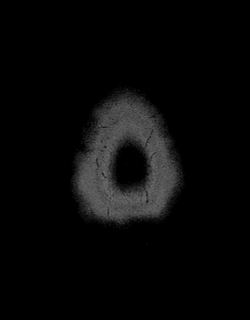

[Series 12: T2 · coronal · 5.0mm · 0.45mm/px · 2 of 27 slices shown (2 of 2)]
[im 1/27]
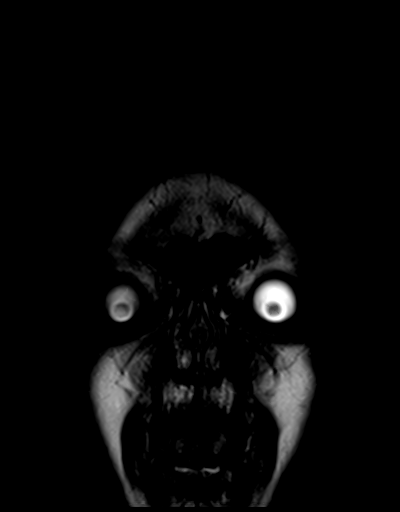
[im 27/27]
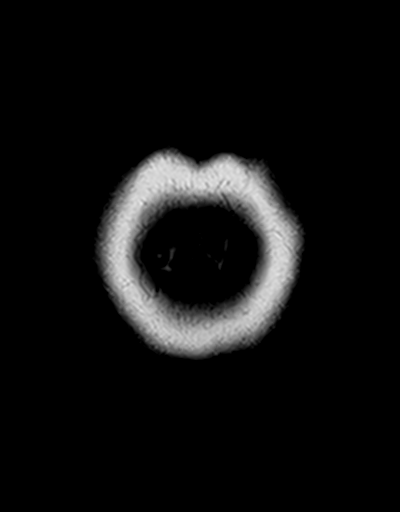

[Series 13: t1_mpr_tra · axial · 1.0mm · 0.75mm/px · z∈[-63,+74]mm · 10 of 144 slices shown (2 of 2)]
[im 1/144]
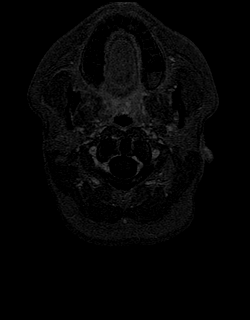
[im 16/144]
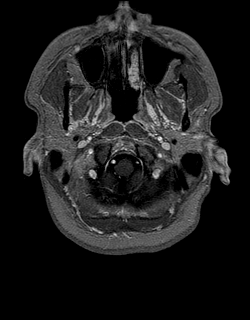
[im 32/144]
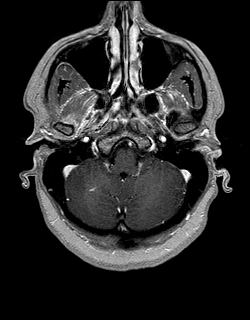
[im 48/144]
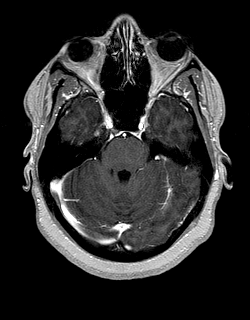
[im 64/144]
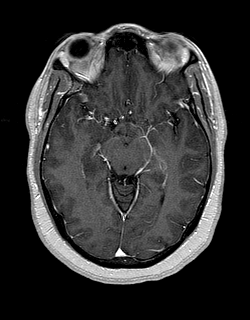
[im 80/144]
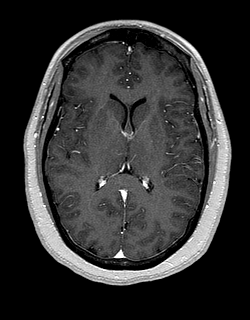
[im 96/144]
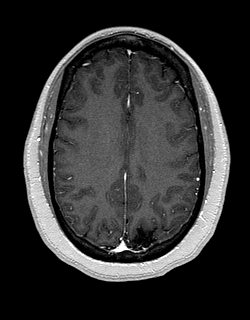
[im 112/144]
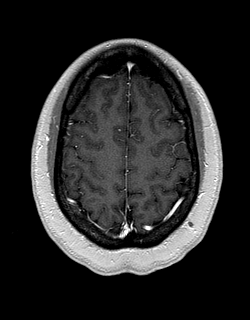
[im 128/144]
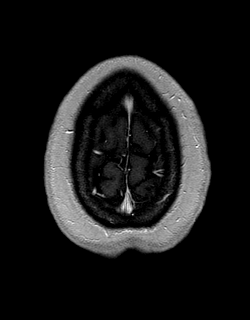
[im 144/144]
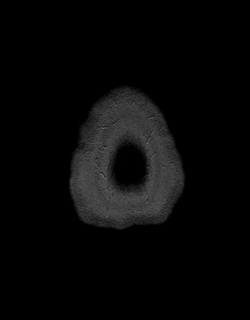

[Series 14: post cor · coronal · 5.0mm · 0.45mm/px · 2 of 27 slices shown]
[im 1/27]
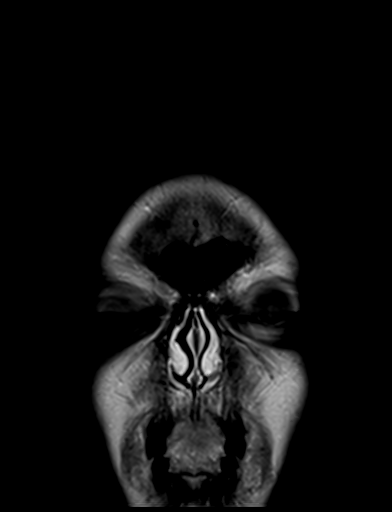
[im 27/27]
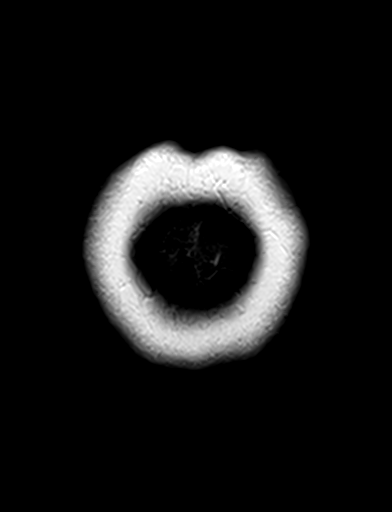

[48 of 48 positions shown; findings below may reference images not displayed]

FINDINGS: Brain: No acute infarction, hemorrhage, hydrocephalus, extra-axial
collection or mass lesion. Did not prominent pituitary gland on
postcontrast imaging, but normal dimensions of 6 mm craniocaudal,
and homogeneous on all sequences. This appearance is likely
accentuated by shallow sella

Vascular: Major flow voids and vascular enhancements are preserved.
Small incidental developmental venous anomaly in the inferior right
cerebellum.

Skull and upper cervical spine: Normal marrow signal

Sinuses/Orbits: Negative
IMPRESSION: Negative exam.  No explanation for headache.

## 2021-01-31 ENCOUNTER — Ambulatory Visit: Payer: BC Managed Care – PPO | Admitting: Family

## 2021-02-02 ENCOUNTER — Ambulatory Visit: Payer: Self-pay | Admitting: Psychology

## 2021-02-07 ENCOUNTER — Encounter: Payer: Self-pay | Admitting: Family

## 2021-02-09 ENCOUNTER — Ambulatory Visit: Payer: BC Managed Care – PPO | Admitting: Psychology

## 2021-02-16 ENCOUNTER — Ambulatory Visit: Payer: BC Managed Care – PPO | Admitting: Psychology

## 2021-02-23 ENCOUNTER — Ambulatory Visit: Payer: BC Managed Care – PPO | Admitting: Psychology

## 2021-03-09 ENCOUNTER — Other Ambulatory Visit: Payer: Self-pay | Admitting: Family

## 2021-03-09 MED ORDER — ESCITALOPRAM OXALATE 20 MG PO TABS: 20.0000 mg | ORAL_TABLET | Freq: Every day | ORAL | 0 refills | Status: DC

## 2021-05-15 NOTE — Progress Notes (Deleted)
NEUROLOGY FOLLOW UP OFFICE NOTE  [name] [id number]  Assessment/Plan:   Migraine without aura, without status migrainosus, not intractable  Migraine prevention:  *** Migraine rescue:  *** Limit use of pain relievers to no more than 2 days out of week to prevent risk of rebound or medication-overuse headache. Keep headache diary Follow up ***   Subjective:  Kelly Walsh is a 40 year old woman with ADHD who follows up for migraine.   UPDATE: Last seen a year ago.  At that time, she was started on nortriptyline.  She was given Roselyn Meier samples *** Intensity:  severe Duration:  Sumatriptan significantly reduced intensity so functional. Frequency:  1 migraine in past 30 days.  However she has 2 to 3 headache days a week.   She does have other headaches but doesn't pay attention to them because they are not severe and usually related to dehydration, hunger or lack of sleep.     Rescue therapy:  Water and takes sumatriptan. Current NSAIDS:  naproxen Current analgesics:  none Current triptans:  Sumatriptan 100mg   Current ergotamine:  none Current anti-emetic:  none Current muscle relaxants:  none Current anti-anxiolytic:  none Current sleep aide:  none Current Antihypertensive medications:  none Current Antidepressant medications:  Nortriptyline 10mg  at bedtime, Lexapro 20mg  Current Anticonvulsant medications:  none Current anti-CGRP:  Ubrelvy 100mg  Current Vitamins/Herbal/Supplements:  none Current Antihistamines/Decongestants:  Claritin, Flonase Other therapy:  none   Caffeine:  1/2 cup of coffee but not daily.  Rarely has a sip of 5 hour energy drink Diet:  Hydrates with water.   Exercise:  No Depression:  some; Anxiety:  yes.  Work-related.   Other pain:  Back pain, right shoulder pain Sleep hygiene:  Varies.  History of chronic fatigue.  She was supposed to have a sleep study but was never able to keep the appointment.     HISTORY:  Onset: Since her early  62s.  They were fairly well controlled and responded to Aleve until gradually getting worse in 2018 in which they have become more frequent and not responding to medication. Location:  bifrontal Quality:  sharp Initial intensity:  Severe.  She denies new headache, thunderclap headache Aura:  no Premonitory Phase:  no Postdrome:  no Associated symptoms: Photophobia, phonophobia, osmophobia.  She denies associated nausea, vomiting, visual disturbance, or unilateral numbness or weakness. Initial duration:  Quick onset.  Usually 3 to 4 days Initial Frequency:  At least once a month Triggers: dehydration, hunger, lack of sleep, stress Relieving factors: Air conditioning, ice pack, rest in quiet dark room Activity:  Cannot function for 3 days   MRI of brain with and without contrast from 01/27/14 was normal.  MRI of brain with and without contrast from 09/07/18 personally reviewed and was normal.   Past NSAIDS:  ibuprofen Past analgesics: Excedrin Migraine, Tylenol Past abortive triptans:  Maxalt Past abortive ergotamine:  none Past muscle relaxants:  none Past anti-emetic:  Zofran 4mg  Past antihypertensive medications:  none Past antidepressant medications:  nortriptyline Past anticonvulsant medications:  topiramate 25mg  twice daily (stopped due to UTI, cloudy urine and ineffective) Past anti-CGRP:  none Past vitamins/Herbal/Supplements:  none Past antihistamines/decongestants:  none Other past therapies:  none     Family history of headache:  No  PAST MEDICAL HISTORY: Past Medical History:  Diagnosis Date   Anemia    H/O   Hx of migraines    IBS (irritable bowel syndrome)     MEDICATIONS: Current Outpatient Medications on  File Prior to Visit  Medication Sig Dispense Refill   amphetamine-dextroamphetamine (ADDERALL) 20 MG tablet Take 1 tablet (20 mg total) by mouth 2 (two) times daily. 60 tablet 0   diclofenac Sodium (VOLTAREN) 1 % GEL Apply 4 g topically 4 (four) times daily.  To affected joint. 100 g 11   escitalopram (LEXAPRO) 20 MG tablet Take 1 tablet (20 mg total) by mouth daily. 90 tablet 0   fluticasone (FLONASE) 50 MCG/ACT nasal spray Place 2 sprays into the nose daily.     loratadine (CLARITIN) 10 MG tablet Take 10 mg by mouth daily.     naproxen sodium (ALEVE) 220 MG tablet Take 220 mg by mouth as needed.     nortriptyline (PAMELOR) 10 MG capsule TAKE 1 CAPSULE BY MOUTH AT BEDTIME. 90 capsule 1   Probiotic Product (PROBIOTIC DAILY PO) Take by mouth.     SUMAtriptan (IMITREX) 100 MG tablet Take 1 tablet earliest onset of migraine.  May repeat in 2 hours if headache persists or recurs.  Maximum 2 tablets in 24 hours (Patient not taking: Reported on 01/20/2021) 10 tablet 3   Ubrogepant (UBRELVY) 100 MG TABS Take 100 mg by mouth as needed (May repeat dose in 2 hours.  Maximum 2 tablets in 24 hours.). 10 tablet 5   No current facility-administered medications on file prior to visit.    ALLERGIES: Allergies  Allergen Reactions   Bupropion Hives   Metformin Nausea Only   Oxycodone-Acetaminophen Itching   Tramadol Itching   Codeine    Oxycodone    Tape     Adhesive allergy     FAMILY HISTORY: Family History  Problem Relation Age of Onset   Diabetes Father    Hypertension Mother    Lupus Cousin       Objective:  *** General: No acute distress.  Patient appears well-groomed.   Head:  Normocephalic/atraumatic Eyes:  Fundi examined but not visualized Neck: supple, no paraspinal tenderness, full range of motion Heart:  Regular rate and rhythm Lungs:  Clear to auscultation bilaterally Back: No paraspinal tenderness Neurological Exam: alert and oriented to person, place, and time.  Speech fluent and not dysarthric, language intact.  CN II-XII intact. Bulk and tone normal, muscle strength 5/5 throughout.  Sensation to light touch intact.  Deep tendon reflexes 2+ throughout, toes downgoing.  Finger to nose testing intact.  Gait normal, Romberg  negative.   Metta Clines, DO  CC: Marrian Salvage, [facility]

## 2021-05-16 ENCOUNTER — Ambulatory Visit: Payer: BC Managed Care – PPO | Admitting: Neurology

## 2021-05-23 ENCOUNTER — Encounter: Payer: Self-pay | Admitting: Family

## 2021-05-29 ENCOUNTER — Other Ambulatory Visit: Payer: Self-pay | Admitting: Family

## 2021-05-29 ENCOUNTER — Encounter: Payer: Self-pay | Admitting: Family

## 2021-05-31 ENCOUNTER — Other Ambulatory Visit: Payer: Self-pay | Admitting: Family

## 2021-05-31 MED ORDER — AMPHETAMINE-DEXTROAMPHETAMINE 20 MG PO TABS: 20.0000 mg | ORAL_TABLET | Freq: Two times a day (BID) | ORAL | 0 refills | Status: DC

## 2021-07-26 ENCOUNTER — Telehealth: Payer: Self-pay

## 2021-07-26 NOTE — Telephone Encounter (Signed)
New message   Received fax from [employer] prior authorization   Ubrelvy 100 mg tablets  No current insurance listed in epic system, Call the patient at  [phone] left a detailed message  to upload her 2023 insurance card to her mychart account.

## 2021-08-22 ENCOUNTER — Encounter: Payer: Self-pay | Admitting: Family

## 2021-08-22 MED ORDER — ESCITALOPRAM OXALATE 20 MG PO TABS: 20.0000 mg | ORAL_TABLET | Freq: Every day | ORAL | 0 refills | Status: DC

## 2021-09-04 MED ORDER — ESCITALOPRAM OXALATE 20 MG PO TABS: 20.0000 mg | ORAL_TABLET | Freq: Every day | ORAL | 0 refills | Status: DC

## 2022-01-27 IMAGING — DX DG FINGER LITTLE 2+V*R*
3 series · 3 of 3 positions shown · non-contrast
Comparison: None.

CLINICAL DATA: Pain fifth proximal interphalangeal joint

EXAM:
RIGHT LITTLE FINGER 2+V

[finger ap]
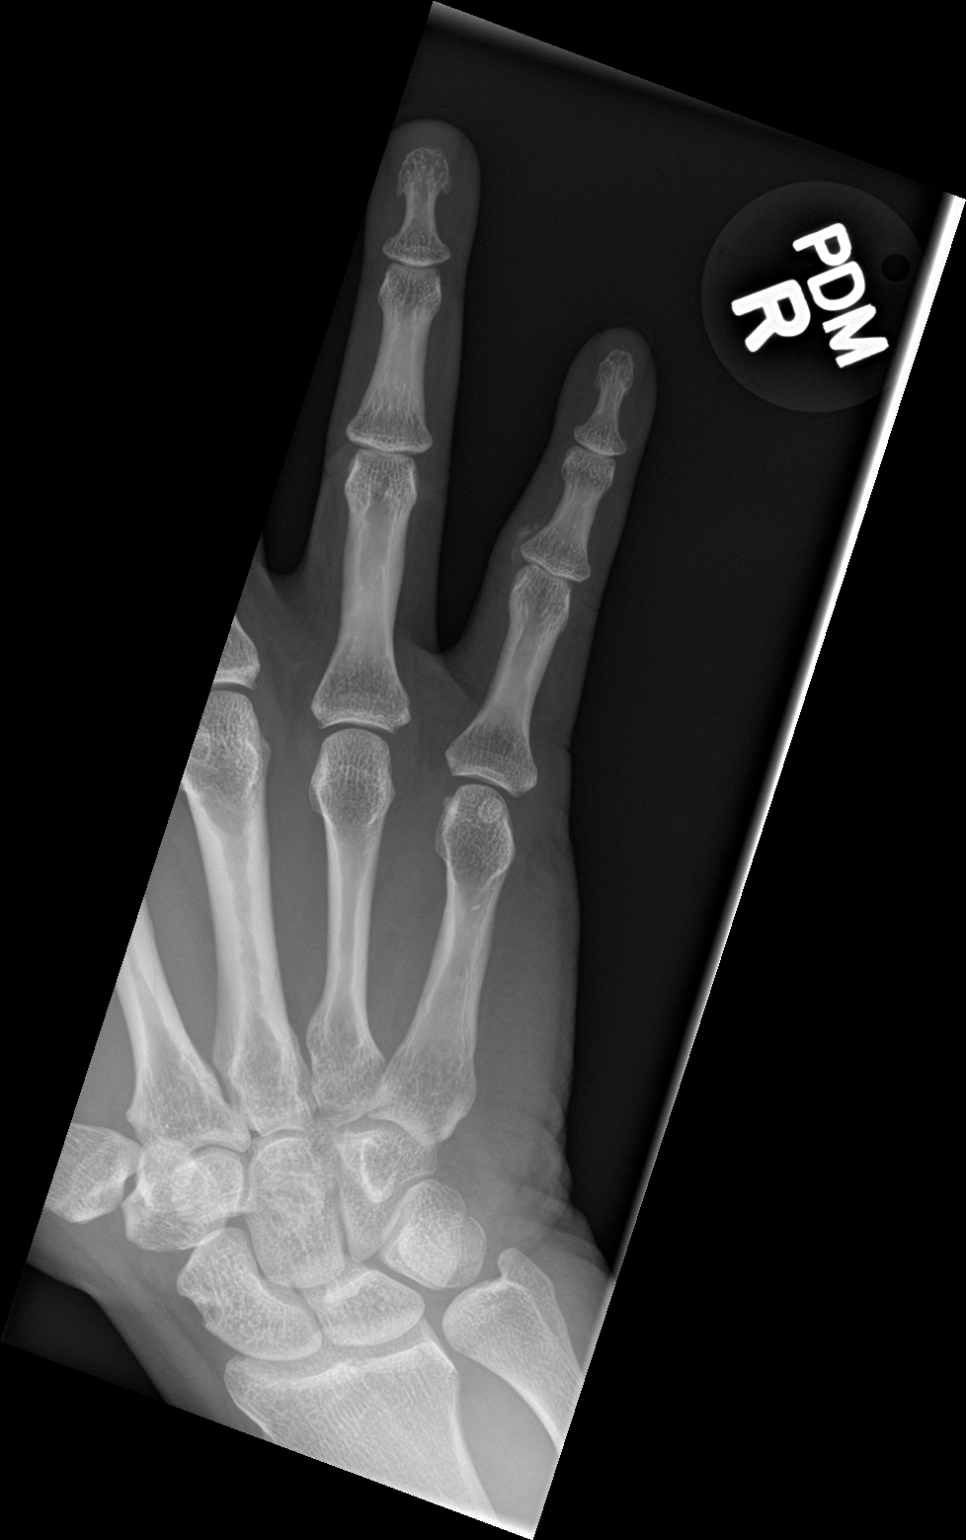

[finger obl]
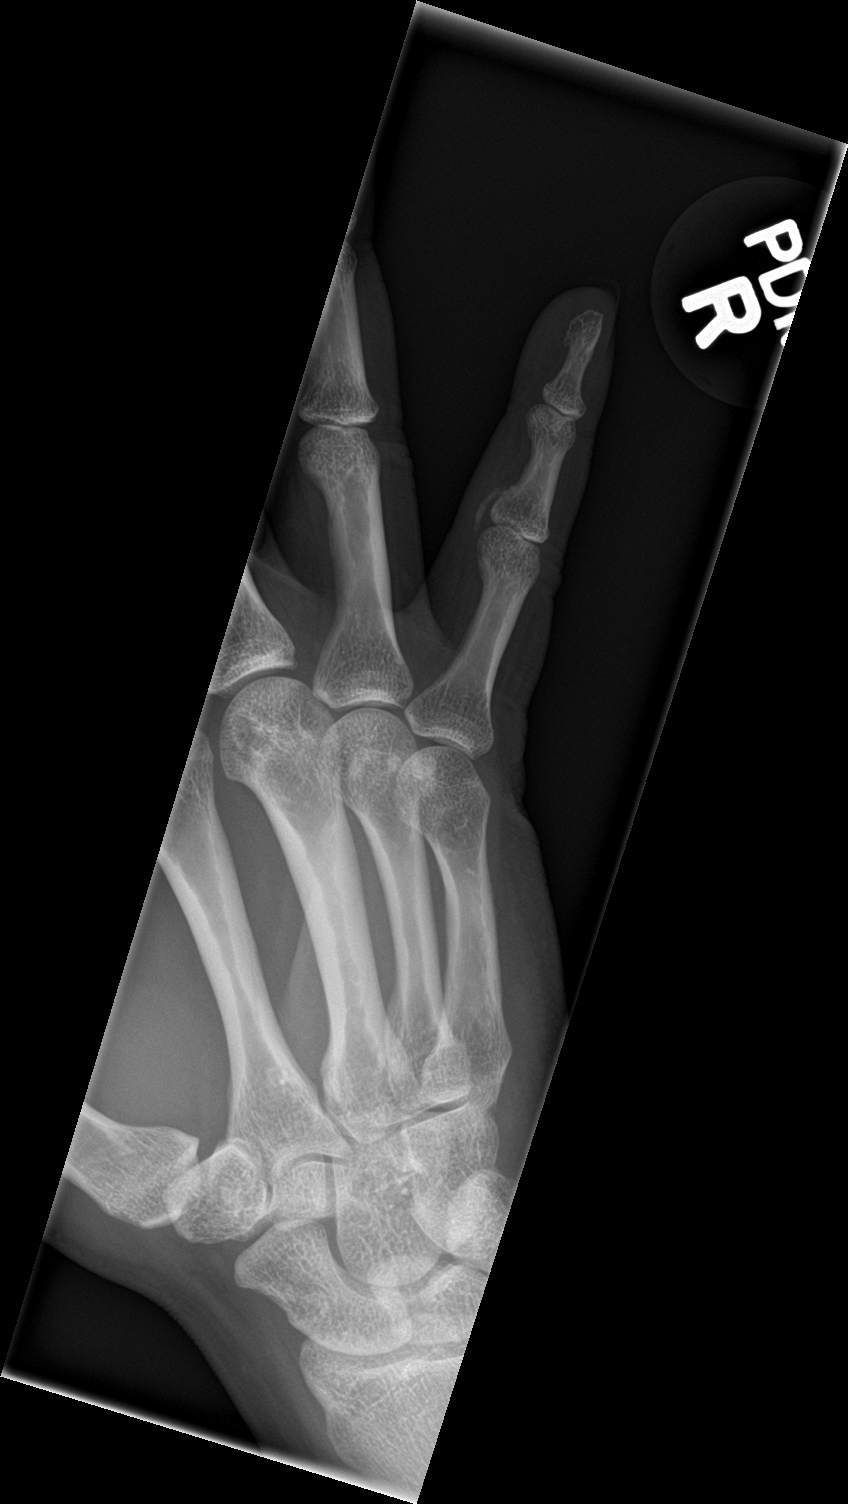

[finger lat]
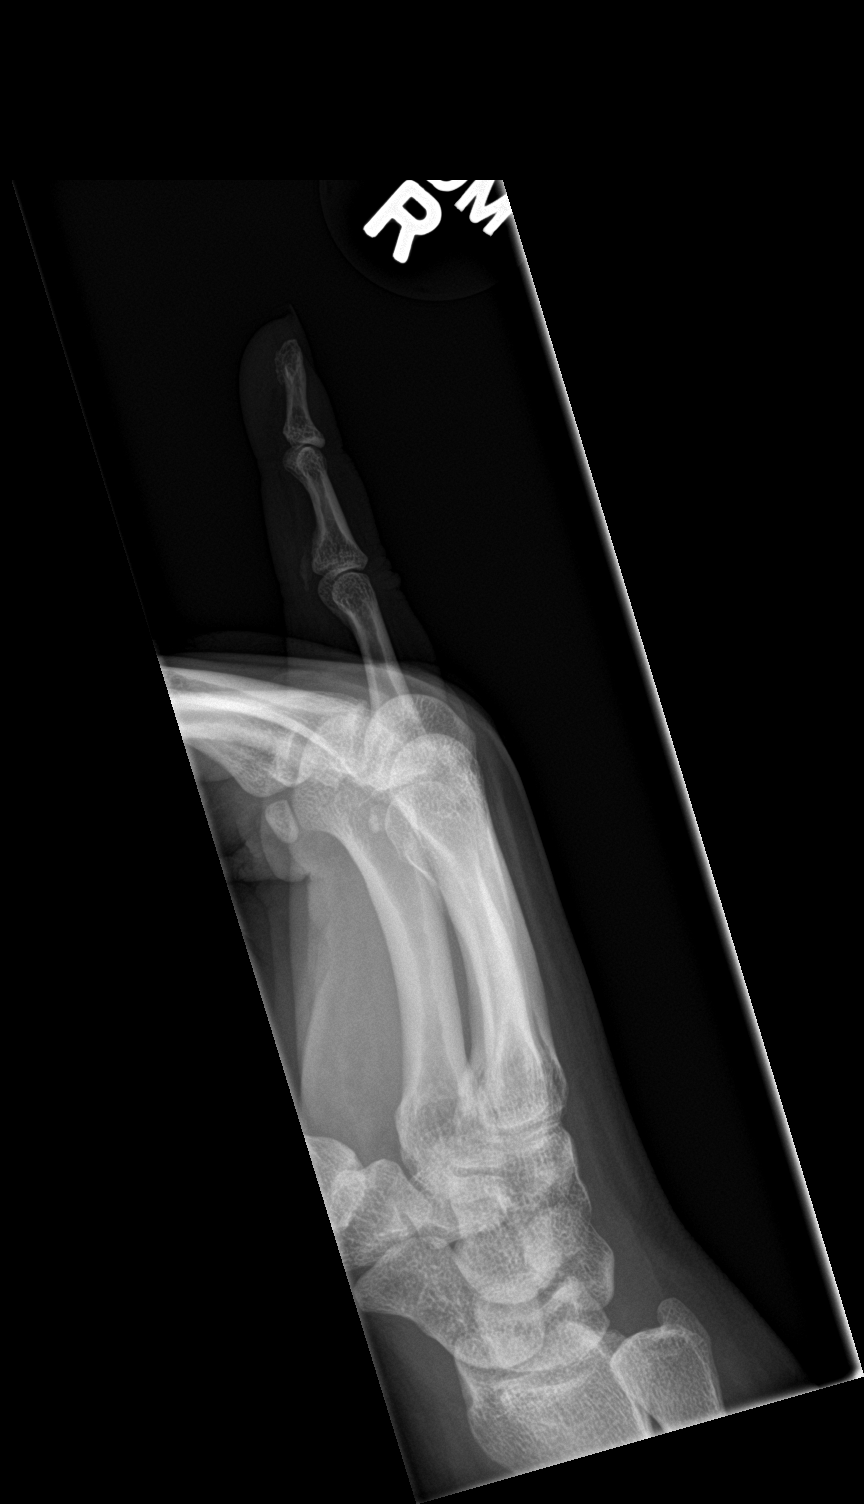

[3 of 3 positions shown; findings below may reference images not displayed]

FINDINGS: There are calcifications along the radial aspect of the proximal
interphalangeal joint of the fifth digit. There is surrounding soft
tissue swelling. There is no acute displaced fracture or
dislocation.
IMPRESSION: 1. No acute displaced fracture or dislocation.
2. Calcifications about the radial aspect of the proximal
interphalangeal joint of the fifth digit. Findings may represent a
collateral ligament injury.

## 2022-08-03 ENCOUNTER — Ambulatory Visit (INDEPENDENT_AMBULATORY_CARE_PROVIDER_SITE_OTHER): Payer: Medicaid Other | Admitting: Family

## 2022-08-03 ENCOUNTER — Encounter: Payer: Self-pay | Admitting: Family

## 2022-08-03 VITALS — BP 120/72 | HR 128 | Resp 18 | Ht 65.0 in | Wt 186.8 lb

## 2022-08-03 DIAGNOSIS — F339 Major depressive disorder, recurrent, unspecified: Secondary | ICD-10-CM | POA: Diagnosis not present

## 2022-08-03 DIAGNOSIS — F419 Anxiety disorder, unspecified: Secondary | ICD-10-CM

## 2022-08-03 DIAGNOSIS — R000 Tachycardia, unspecified: Secondary | ICD-10-CM | POA: Diagnosis not present

## 2022-08-03 DIAGNOSIS — F988 Other specified behavioral and emotional disorders with onset usually occurring in childhood and adolescence: Secondary | ICD-10-CM | POA: Diagnosis not present

## 2022-08-03 MED ORDER — ESCITALOPRAM OXALATE 10 MG PO TABS: ORAL_TABLET | ORAL | 0 refills | Status: DC

## 2022-08-03 MED ORDER — NYSTATIN-TRIAMCINOLONE 100000-0.1 UNIT/GM-% EX CREA: 1.0000 | TOPICAL_CREAM | Freq: Two times a day (BID) | CUTANEOUS | 0 refills | Status: AC

## 2022-08-03 NOTE — Progress Notes (Signed)
Kelly Walsh is a 42 y.o. female with the following history as recorded in Epic[employer]:  Patient Active Problem List   Diagnosis Date Noted   Migraine headache 08/25/2018   Fibroids 08/25/2018   Allergic rhinitis 08/25/2018   Complex regional pain syndrome of lower limb 10/07/2017    Current Outpatient Medications  Medication Sig Dispense Refill   diclofenac Sodium (VOLTAREN) 1 % GEL Apply 4 g topically 4 (four) times daily. To affected joint. 100 g 11   fluticasone (FLONASE) 50 MCG/ACT nasal spray Place 2 sprays into the nose daily.     loratadine (CLARITIN) 10 MG tablet Take 10 mg by mouth daily.     naproxen sodium (ALEVE) 220 MG tablet Take 220 mg by mouth as needed.     nortriptyline (PAMELOR) 10 MG capsule TAKE 1 CAPSULE BY MOUTH AT BEDTIME. 90 capsule 1   Probiotic Product (PROBIOTIC DAILY PO) Take by mouth.     amphetamine-dextroamphetamine (ADDERALL) 20 MG tablet Take 1 tablet (20 mg total) by mouth 2 (two) times daily. (Patient not taking: Reported on 08/03/2022) 60 tablet 0   escitalopram (LEXAPRO) 20 MG tablet Take 1 tablet (20 mg total) by mouth daily. (Patient not taking: Reported on 08/03/2022) 30 tablet 0   SUMAtriptan (IMITREX) 100 MG tablet Take 1 tablet earliest onset of migraine.  May repeat in 2 hours if headache persists or recurs.  Maximum 2 tablets in 24 hours (Patient not taking: Reported on 08/03/2022) 10 tablet 3   Ubrogepant (UBRELVY) 100 MG TABS Take 100 mg by mouth as needed (May repeat dose in 2 hours.  Maximum 2 tablets in 24 hours.). (Patient not taking: Reported on 08/03/2022) 10 tablet 5   No current facility-administered medications for this visit.    Allergies: Bupropion, Metformin, Oxycodone-acetaminophen, Tramadol, Codeine, Oxycodone, and Tape  Past Medical History:  Diagnosis Date   Anemia    H/O   Hx of migraines    IBS (irritable bowel syndrome)     Past Surgical History:  Procedure Laterality Date   FOOT SURGERY Right    WISDOM TOOTH EXTRACTION       Family History  Problem Relation Age of Onset   Diabetes Father    Hypertension Mother    Lupus Cousin     Social History   Tobacco Use   Smoking status: Never   Smokeless tobacco: Never  Substance Use Topics   Alcohol use: No    Subjective:   Has not been seen since 2022; would like to discuss refills for her anxiety medications; notes however that her depression has been extremely problematic since 2019- feels like this is the worst it has been; just don't feel motivated at all;  Has done well on Lexapro in the past- has been stretching out her Lexapro 20 mg to make her prescription last; has taken Adderall in the past;  Notes she has been told her pulse was elevated in the past but not monitoring regularly;   Objective:  Vitals:   08/03/22 1425  BP: 120/72  Pulse: (!) 128  Resp: 18  SpO2: 99%  Weight: 186 lb 12.8 oz (84.7 kg)  Height: '5\' 5"'$  (1.651 m)    General: Well developed, well nourished, in no acute distress  Skin : Warm and dry.  Head: Normocephalic and atraumatic  Lungs: Respirations unlabored;  CVS exam: tachycardia noted Neurologic: Alert and oriented; speech intact; face symmetrical; moves all extremities well; CNII-XII intact without focal deficit  Assessment:  1. Depression, recurrent (  [place])   2. Anxiety   3. Attention deficit disorder, unspecified hyperactivity presence   4. Tachycardia     Plan:  & 2. Due to worsening symptoms/ patient concerns for being uncontrolled x 5 years, recommend psychiatrist follow up; patient understands that different medication options will be available to her from specialist; would also encourage her to reach back out to her former therapist; Did very well on Lexapro in the past- will re-start at 10 mg x 2 weeks and then increase to 20 mg daily; she understands this may be changed completely based on psychiatrist's recommendation; & 4. EKG shows sinus tachycardia; she is referred to cardiologist for further evaluation;  cannot re-start stimulant until cardiac clearance is given;   No follow-ups on file.  Orders Placed This Encounter  Procedures   Ambulatory referral to Psychiatry    Referral Priority:   Routine    Referral Type:   Psychiatric    Referral Reason:   Specialty Services Required    Requested Specialty:   Psychiatry    Number of Visits Requested:   1   Ambulatory referral to Cardiology    Referral Priority:   Routine    Referral Type:   Consultation    Referral Reason:   Specialty Services Required    Requested Specialty:   Cardiology    Number of Visits Requested:   1    Requested Prescriptions    No prescriptions requested or ordered in this encounter

## 2022-08-04 LAB — COMPREHENSIVE METABOLIC PANEL
AG Ratio: 1.4 (calc) (ref 1.0–2.5)
ALT: 13 U/L (ref 6–29)
AST: 15 U/L (ref 10–30)
Albumin: 4.1 g/dL (ref 3.6–5.1)
Alkaline phosphatase (APISO): 100 U/L (ref 31–125)
BUN/Creatinine Ratio: 12 (calc) (ref 6–22)
BUN: 12 mg/dL (ref 7–25)
CO2: 25 mmol/L (ref 20–32)
Calcium: 10.4 mg/dL — ABNORMAL HIGH (ref 8.6–10.2)
Chloride: 104 mmol/L (ref 98–110)
Creat: 1.02 mg/dL — ABNORMAL HIGH (ref 0.50–0.99)
Globulin: 3 g/dL (calc) (ref 1.9–3.7)
Glucose, Bld: 65 mg/dL (ref 65–99)
Potassium: 3.8 mmol/L (ref 3.5–5.3)
Sodium: 140 mmol/L (ref 135–146)
Total Bilirubin: 0.5 mg/dL (ref 0.2–1.2)
Total Protein: 7.1 g/dL (ref 6.1–8.1)

## 2022-08-04 LAB — CBC WITH DIFFERENTIAL/PLATELET
Absolute Monocytes: 600 cells/uL (ref 200–950)
Basophils Absolute: 28 cells/uL (ref 0–200)
Basophils Relative: 0.4 %
Eosinophils Absolute: 69 cells/uL (ref 15–500)
Eosinophils Relative: 1 %
HCT: 37.3 % (ref 35.0–45.0)
Hemoglobin: 12.2 g/dL (ref 11.7–15.5)
Lymphs Abs: 2422 cells/uL (ref 850–3900)
MCH: 28.2 pg (ref 27.0–33.0)
MCHC: 32.7 g/dL (ref 32.0–36.0)
MCV: 86.3 fL (ref 80.0–100.0)
MPV: 12.3 fL (ref 7.5–12.5)
Monocytes Relative: 8.7 %
Neutro Abs: 3781 cells/uL (ref 1500–7800)
Neutrophils Relative %: 54.8 %
Platelets: 227 10*3/uL (ref 140–400)
RBC: 4.32 10*6/uL (ref 3.80–5.10)
RDW: 13.9 % (ref 11.0–15.0)
Total Lymphocyte: 35.1 %
WBC: 6.9 10*3/uL (ref 3.8–10.8)

## 2022-08-04 LAB — TSH: TSH: 3.45 mIU/L

## 2022-08-04 LAB — VITAMIN B12: Vitamin B-12: 611 pg/mL (ref 200–1100)

## 2022-08-14 NOTE — Progress Notes (Signed)
Letter has been placed up front to be mailed out.

## 2022-08-15 ENCOUNTER — Other Ambulatory Visit: Payer: Self-pay | Admitting: Family

## 2022-08-15 ENCOUNTER — Encounter: Payer: Self-pay | Admitting: Family

## 2022-08-15 DIAGNOSIS — H539 Unspecified visual disturbance: Secondary | ICD-10-CM

## 2022-08-23 ENCOUNTER — Encounter: Payer: Self-pay | Admitting: Interventional Cardiology

## 2022-08-23 ENCOUNTER — Ambulatory Visit: Payer: Medicaid Other | Attending: Interventional Cardiology | Admitting: Interventional Cardiology

## 2022-08-23 VITALS — BP 122/84 | HR 96 | Ht 65.0 in | Wt 184.0 lb

## 2022-08-23 DIAGNOSIS — I479 Paroxysmal tachycardia, unspecified: Secondary | ICD-10-CM | POA: Diagnosis not present

## 2022-08-23 DIAGNOSIS — R0609 Other forms of dyspnea: Secondary | ICD-10-CM | POA: Diagnosis not present

## 2022-08-23 NOTE — Patient Instructions (Signed)
Medication Instructions:  Your physician recommends that you continue on your current medications as directed. Please refer to the Current Medication list given to you today.  *If you need a refill on your cardiac medications before your next appointment, please call your pharmacy*  Testing/Procedures: Your physician has requested that you have an echocardiogram. Echocardiography is a painless test that uses sound waves to create images of your heart. It provides your doctor with information about the size and shape of your heart and how well your heart's chambers and valves are working. This procedure takes approximately one hour. There are no restrictions for this procedure. Please do NOT wear cologne, perfume, aftershave, or lotions (deodorant is allowed). Please arrive 15 minutes prior to your appointment time.  Follow-Up: At [facility], you and your health needs are our priority.  As part of our continuing mission to provide you with exceptional heart care, we have created designated Provider Care Teams.  These Care Teams include your primary Cardiologist (physician) and Advanced Practice Providers (APPs -  Physician Assistants and Nurse Practitioners) who all work together to provide you with the care you need, when you need it.  Your next appointment:   As needed with Dr. Irish Lack

## 2022-08-23 NOTE — Progress Notes (Signed)
Cardiology Office Note   Date:  08/23/2022   ID:  Kelly Walsh, DOB [birth year], MRN [id number]  PCP:  Kelly Salvage, FNP    No chief complaint on file.  tachycardia  Wt Readings from Last 3 Encounters:  08/23/22 184 lb (83.5 kg)  08/03/22 186 lb 12.8 oz (84.7 kg)  01/20/21 194 lb 3.2 oz (88.1 kg)       History of Present Illness: Kelly Walsh is a 42 y.o. female who is being seen today for the evaluation of tachycardia at the request of Kelly Walsh,*.   She could not have her Adderall filled due to tachycardia.  She reports that her pulse if often over 100.  She has had palpitations in the past but they are rare.  She reports some DOE.   She thinks she was out of shape after the pandemic.  SHe has not gotten back to exercise.  She was eating poorly and gained weight.   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    Denies : Chest pain. Dizziness. Leg edema. Nitroglycerin use. Orthopnea. Palpitations. Paroxysmal nocturnal dyspnea.  Syncope.    Past Medical History:  Diagnosis Date   Anemia    H/O   Hx of migraines    IBS (irritable bowel syndrome)     Past Surgical History:  Procedure Laterality Date   FOOT SURGERY Right    WISDOM TOOTH EXTRACTION       Current Outpatient Medications  Medication Sig Dispense Refill   escitalopram (LEXAPRO) 10 MG tablet Take 1 tablet daily x 2 weeks; then increase to 2 tablets/ daily 60 tablet 0   fluticasone (FLONASE) 50 MCG/ACT nasal spray Place 2 sprays into the nose daily.     loratadine (CLARITIN) 10 MG tablet Take 10 mg by mouth daily.     naproxen sodium (ALEVE) 220 MG tablet Take 220 mg by mouth as needed.     nystatin-triamcinolone (MYCOLOG II) cream Apply 1 Application topically 2 (two) times daily. 30 g 0   Probiotic Product (PROBIOTIC DAILY PO) Take by mouth.     SUMAtriptan (IMITREX) 100 MG tablet Take 1 tablet earliest onset of migraine.  May repeat in 2 hours if headache persists or  recurs.  Maximum 2 tablets in 24 hours 10 tablet 3   Ubrogepant (UBRELVY) 100 MG TABS Take 100 mg by mouth as needed (May repeat dose in 2 hours.  Maximum 2 tablets in 24 hours.). 10 tablet 5   No current facility-administered medications for this visit.    Allergies:   Bupropion, Metformin, Oxycodone-acetaminophen, Tramadol, Codeine, Oxycodone, and Tape    Social History:  The patient  reports that she has never smoked. She has never used smokeless tobacco. She reports that she does not drink alcohol and does not use drugs.   Family History:  The patient's family history includes Diabetes in her father; Hypertension in her mother; Lupus in her cousin.    ROS:  Please see the history of present illness.   Otherwise, review of systems are positive for DOE.   All other systems are reviewed and negative.    PHYSICAL EXAM: VS:  BP 122/84   Pulse 96   Ht 5' 5"$  (1.651 m)   Wt 184 lb (83.5 kg)   LMP 07/12/2022   SpO2 96%   BMI 30.62 kg/m  , BMI Body mass index is 30.62 kg/m. GEN: Well nourished, well developed, in no acute distress HEENT:  normal Neck: no JVD, carotid bruits, or masses Cardiac: RRR; no murmurs, rubs, or gallops,no edema  Respiratory:  clear to auscultation bilaterally, normal work of breathing GI: soft, nontender, nondistended, + BS MS: no deformity or atrophy Skin: warm and dry, no rash Neuro:  Strength and sensation are intact Psych: euthymic mood, full affect   EKG:   The ekg ordered today demonstrates NSR, no ST segment   Recent Labs: 08/03/2022: ALT 13; BUN 12; Creat 1.02; Hemoglobin 12.2; Platelets 227; Potassium 3.8; Sodium 140; TSH 3.45   Lipid Panel No results found for: "CHOL", "TRIG", "HDL", "CHOLHDL", "VLDL", "LDLCALC", "LDLDIRECT"   Other studies Reviewed: Additional studies/ records that were reviewed today with results demonstrating: normal TSH in 2/24.   ASSESSMENT AND PLAN:  Tachycardia/DOE: Will check echo to eval for low EF.  May be  somewhat deconditioned due to inactivity.     Whole food, plant-based diet.  High-fiber diet. avoid processed foods.  Increase activity to the target below.  We talked extensively about these lifestyle changes to try to help her with her generalized fatigue. Screening lipid test is indicated when fasting. Can do with PCP.    Current medicines are reviewed at length with the patient today.  The patient concerns regarding her medicines were addressed.  The following changes have been made:  No change  Labs/ tests ordered today include:  No orders of the defined types were placed in this encounter.   Recommend 150 minutes/week of aerobic exercise Low fat, low carb, high fiber diet recommended  Disposition:   FU in prn   Signed, Larae Grooms, MD  08/23/2022 4:20 PM    [facility] [address], [place], [place]  [address] Phone: [phone]; Fax: [phone]

## 2022-09-11 ENCOUNTER — Encounter: Payer: Self-pay | Admitting: Family

## 2022-09-11 ENCOUNTER — Other Ambulatory Visit: Payer: Self-pay | Admitting: Family

## 2022-09-11 MED ORDER — ESCITALOPRAM OXALATE 10 MG PO TABS: 20.0000 mg | ORAL_TABLET | Freq: Every day | ORAL | 0 refills | Status: DC

## 2022-09-11 MED ORDER — ESCITALOPRAM OXALATE 10 MG PO TABS: ORAL_TABLET | ORAL | 0 refills | Status: DC

## 2022-09-19 ENCOUNTER — Ambulatory Visit (INDEPENDENT_AMBULATORY_CARE_PROVIDER_SITE_OTHER): Payer: Medicaid Other | Admitting: Physician Assistant

## 2022-09-19 DIAGNOSIS — F332 Major depressive disorder, recurrent severe without psychotic features: Secondary | ICD-10-CM | POA: Insufficient documentation

## 2022-09-19 DIAGNOSIS — F331 Major depressive disorder, recurrent, moderate: Secondary | ICD-10-CM

## 2022-09-19 DIAGNOSIS — F411 Generalized anxiety disorder: Secondary | ICD-10-CM

## 2022-09-19 MED ORDER — SERTRALINE HCL 50 MG PO TABS: 50.0000 mg | ORAL_TABLET | Freq: Every day | ORAL | 1 refills | Status: DC
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
## 2022-09-20 ENCOUNTER — Ambulatory Visit (HOSPITAL_COMMUNITY): Payer: Medicaid Other | Attending: Cardiology

## 2022-09-20 ENCOUNTER — Encounter (HOSPITAL_COMMUNITY): Payer: Self-pay | Admitting: Physician Assistant

## 2022-09-20 DIAGNOSIS — I479 Paroxysmal tachycardia, unspecified: Secondary | ICD-10-CM | POA: Diagnosis not present

## 2022-09-20 DIAGNOSIS — R0609 Other forms of dyspnea: Secondary | ICD-10-CM | POA: Diagnosis not present

## 2022-09-20 LAB — ECHOCARDIOGRAM COMPLETE
Area-P 1/2: 5.5 cm2
S' Lateral: 2.6 cm

## 2022-09-28 ENCOUNTER — Telehealth (HOSPITAL_COMMUNITY): Payer: Self-pay | Admitting: *Deleted

## 2022-09-28 NOTE — Telephone Encounter (Signed)
Patient called asking if it's ok for her to take Adderall with her other medications. She states her PCP has added Adderall back to her medication list. Message sent to MD for review.

## 2022-10-23 ENCOUNTER — Encounter: Payer: Self-pay | Admitting: Family

## 2022-10-31 ENCOUNTER — Ambulatory Visit (INDEPENDENT_AMBULATORY_CARE_PROVIDER_SITE_OTHER): Payer: Medicaid Other | Admitting: Physician Assistant

## 2022-10-31 VITALS — BP 130/72 | HR 96 | Temp 98.9°F | Ht 65.0 in | Wt 183.0 lb

## 2022-10-31 DIAGNOSIS — F331 Major depressive disorder, recurrent, moderate: Secondary | ICD-10-CM

## 2022-10-31 DIAGNOSIS — F988 Other specified behavioral and emotional disorders with onset usually occurring in childhood and adolescence: Secondary | ICD-10-CM | POA: Diagnosis not present

## 2022-10-31 DIAGNOSIS — F411 Generalized anxiety disorder: Secondary | ICD-10-CM

## 2022-10-31 MED ORDER — SERTRALINE HCL 100 MG PO TABS: 100.0000 mg | ORAL_TABLET | Freq: Every day | ORAL | 1 refills | Status: DC

## 2022-11-01 MED ORDER — AMPHETAMINE-DEXTROAMPHETAMINE 10 MG PO TABS: 10.0000 mg | ORAL_TABLET | Freq: Every day | ORAL | 0 refills | Status: DC

## 2022-11-03 ENCOUNTER — Encounter (HOSPITAL_COMMUNITY): Payer: Self-pay | Admitting: Physician Assistant

## 2022-11-03 NOTE — Progress Notes (Signed)
BH MD/PA/NP OP Progress Note  11/03/2022 6:37 PM Kelly Walsh  MRN:  [id number]  Chief Complaint:  Chief Complaint  Patient presents with   Medication Management   Follow-up   HPI: ***  Kelly Walsh. Esquilin  Visit Diagnosis:    ICD-10-CM   1. Attention deficit disorder, unspecified hyperactivity presence  F98.8 amphetamine-dextroamphetamine (ADDERALL) 10 MG tablet    2. Anxiety state  F41.1 sertraline (ZOLOFT) 100 MG tablet    3. Moderate episode of recurrent major depressive disorder (HCC)  F33.1 sertraline (ZOLOFT) 100 MG tablet      Past Psychiatric History:  Patient has a past psychiatric history significant for depression, anxiety, and ADD (unspecified hyperactivity present)   Patient denies a past history of hospitalization due to mental health   Patient denies a past history of suicide attempt Patient denies a past history of homicide attempt  Past Medical History:  Past Medical History:  Diagnosis Date   Anemia    H/O   Hx of migraines    IBS (irritable bowel syndrome)     Past Surgical History:  Procedure Laterality Date   FOOT SURGERY Right    WISDOM TOOTH EXTRACTION      Family Psychiatric History:  Patient is unsure of family history of psychiatric illness   Family history of suicide attempt: Patient denies Family history of homicide: Patient denies Family history of substance abuse: Patient endorses a family history of substance abuse but is unsure of the details  Family History:  Family History  Problem Relation Age of Onset   Diabetes Father    Hypertension Mother    Lupus Cousin     Social History:  Social History   Socioeconomic History   Marital status: Single    Spouse name: Not on file   Number of children: 0   Years of education: Not on file   Highest education level: Some college, no degree  Occupational History   Not on file  Tobacco Use   Smoking status: Never   Smokeless tobacco: Never  Vaping Use   Vaping Use: Never  used  Substance and Sexual Activity   Alcohol use: No   Drug use: No   Sexual activity: Yes    Birth control/protection: Condom  Other Topics Concern   Not on file  Social History Narrative   Right handed, lives in an apartment on 2nd floor alone.   Pt drinks coffee, occasionally hot tea, soda sometimes, water   No children   Does not exercises regularly    Social Determinants of Health   Financial Resource Strain: Not on file  Food Insecurity: Not on file  Transportation Needs: Not on file  Physical Activity: Not on file  Stress: Not on file  Social Connections: Not on file    Allergies:  Allergies  Allergen Reactions   Bupropion Hives   Metformin Nausea Only   Oxycodone-Acetaminophen Itching   Tramadol Itching   Codeine    Oxycodone    Tape     Adhesive allergy     Metabolic Disorder Labs: No results found for: "HGBA1C", "MPG" No results found for: "PROLACTIN" No results found for: "CHOL", "TRIG", "HDL", "CHOLHDL", "VLDL", "LDLCALC" Lab Results  Component Value Date   TSH 3.45 08/03/2022   TSH 2.38 09/01/2019    Therapeutic Level Labs: No results found for: "LITHIUM" No results found for: "VALPROATE" No results found for: "CBMZ"  Current Medications: Current Outpatient Medications  Medication Sig Dispense Refill   amphetamine-dextroamphetamine (ADDERALL)  10 MG tablet Take 1 tablet (10 mg total) by mouth daily. 30 tablet 0   fluticasone (FLONASE) 50 MCG/ACT nasal spray Place 2 sprays into the nose daily.     loratadine (CLARITIN) 10 MG tablet Take 10 mg by mouth daily.     naproxen sodium (ALEVE) 220 MG tablet Take 220 mg by mouth as needed.     nystatin-triamcinolone (MYCOLOG II) cream Apply 1 Application topically 2 (two) times daily. 30 g 0   Probiotic Product (PROBIOTIC DAILY PO) Take by mouth.     sertraline (ZOLOFT) 100 MG tablet Take 1 tablet (100 mg total) by mouth daily. 30 tablet 1   SUMAtriptan (IMITREX) 100 MG tablet Take 1 tablet earliest  onset of migraine.  May repeat in 2 hours if headache persists or recurs.  Maximum 2 tablets in 24 hours 10 tablet 3   Ubrogepant (UBRELVY) 100 MG TABS Take 100 mg by mouth as needed (May repeat dose in 2 hours.  Maximum 2 tablets in 24 hours.). 10 tablet 5   No current facility-administered medications for this visit.     Musculoskeletal: Strength & Muscle Tone: within normal limits Gait & Station: normal Patient leans: N/A  Psychiatric Specialty Exam: Review of Systems  Psychiatric/Behavioral:  Positive for decreased concentration and sleep disturbance. Negative for dysphoric mood, hallucinations, self-injury and suicidal ideas. The patient is nervous/anxious. The patient is not hyperactive.     Blood pressure 130/72, pulse 96, temperature 98.9 F (37.2 C), temperature source Oral, height 5\' 5"  (1.651 m), weight 183 lb (83 kg).Body mass index is 30.45 kg/m.  General Appearance: Casual  Eye Contact:  Good  Speech:  Clear and Coherent and Normal Rate  Volume:  Normal  Mood:  Anxious and Depressed  Affect:  Appropriate  Thought Process:  Coherent, Goal Directed, and Descriptions of Associations: Intact  Orientation:  Full (Time, Place, and Person)  Thought Content: WDL   Suicidal Thoughts:  No  Homicidal Thoughts:  No  Memory:  Immediate;   Good Recent;   Good Remote;   Good  Judgement:  Good  Insight:  Fair  Psychomotor Activity:  Normal  Concentration:  Concentration: Good and Attention Span: Good  Recall:  Good  Fund of Knowledge: Good  Language: Good  Akathisia:  No  Handed:  Right  AIMS (if indicated): not done  Assets:  Communication Skills Desire for Improvement Financial Resources/Insurance Housing Physical Health Social Support Transportation  ADL's:  Intact  Cognition: WNL  Sleep:  Fair   Screenings: GAD-7    Flowsheet Row Clinical Support from 10/31/2022 in [facility] Office Visit from 09/19/2022 in [facility]  Total GAD-7 Score 17 20      PHQ2-9    Flowsheet Row Clinical Support from 10/31/2022 in [facility] Office Visit from 09/19/2022 in [facility] Office Visit from 01/20/2021 in [facility] Office Visit from 08/25/2018 in [facility]  PHQ-2 Total Score 6 6 6 5   PHQ-9 Total Score 21 22 24 19       Flowsheet Row Clinical Support from 10/31/2022 in [facility] Office Visit from 09/19/2022 in [facility]  C-SSRS RISK CATEGORY No Risk Low Risk        Assessment and Plan: ***    Collaboration of Care: Collaboration of Care: Medication Management AEB provider managing patient's psychiatric medications  and Psychiatrist AEB patient being followed by a mental health provider  Patient/Guardian was advised Release of Information must be obtained prior to any record release in order to collaborate their care with an outside provider. Patient/Guardian was advised if they have not already done so to contact the registration department to sign all necessary forms in order for [place] to release information regarding their care.   Consent: Patient/Guardian gives verbal consent for treatment and assignment of benefits for services provided during this visit. Patient/Guardian expressed understanding and agreed to proceed.   1. Anxiety state  - sertraline (ZOLOFT) 100 MG tablet; Take 1 tablet (100 mg total) by mouth daily.  Dispense: 30 tablet; Refill: 1  2. Moderate episode of recurrent major depressive disorder (HCC)  - sertraline (ZOLOFT) 100 MG tablet; Take 1 tablet (100 mg total) by mouth daily.  Dispense: 30 tablet; Refill: 1  3. Attention deficit disorder, unspecified hyperactivity presence  - amphetamine-dextroamphetamine (ADDERALL) 10 MG tablet; Take 1 tablet (10 mg total) by mouth daily.   Dispense: 30 tablet; Refill: 0  Patient to follow up in 6 weeks Provider spent a total of 24 minutes with the patient/reviewing the patient's chart  Meta Hatchet, PA 11/03/2022, 6:37 PM

## 2022-12-13 ENCOUNTER — Ambulatory Visit (INDEPENDENT_AMBULATORY_CARE_PROVIDER_SITE_OTHER): Payer: Medicaid Other | Admitting: Physician Assistant

## 2022-12-13 DIAGNOSIS — F331 Major depressive disorder, recurrent, moderate: Secondary | ICD-10-CM

## 2022-12-13 DIAGNOSIS — F411 Generalized anxiety disorder: Secondary | ICD-10-CM

## 2022-12-13 DIAGNOSIS — F988 Other specified behavioral and emotional disorders with onset usually occurring in childhood and adolescence: Secondary | ICD-10-CM

## 2022-12-13 MED ORDER — AMPHETAMINE-DEXTROAMPHETAMINE 10 MG PO TABS: 10.0000 mg | ORAL_TABLET | Freq: Every day | ORAL | 0 refills | Status: DC

## 2022-12-13 MED ORDER — SERTRALINE HCL 50 MG PO TABS: 150.0000 mg | ORAL_TABLET | Freq: Every day | ORAL | 1 refills | Status: DC

## 2022-12-13 MED ORDER — ARIPIPRAZOLE 5 MG PO TABS: 5.0000 mg | ORAL_TABLET | Freq: Every day | ORAL | 1 refills | Status: DC

## 2022-12-16 NOTE — Progress Notes (Signed)
BH MD/PA/NP OP Progress Note  12/16/2022 12:13 PM Kelly Walsh  MRN:  [id number]  Chief Complaint:  Chief Complaint  Patient presents with   Follow-up   Medication Management   HPI:   Kelly Walsh is a 42 year old, African-American female with a past psychiatric history significant for attention deficit disorder (unspecified hyperactivity present), anxiety, and major depressive disorder who presents to [facility] for follow-up and medication management.  Patient is currently being managed on the following psychiatric medication:   Zoloft 100 mg daily Amphetamine-dextroamphetamine (Adderall) 10 mg daily  Patient presents today encounter stating that her Adderall dosage does not appear to be as strong as her previous prescription.  Patient states that she was previously taking Adderall pills that were pink but now her current prescription has blue pills.  She reports that her blue Adderall pills do not make her feel the same way as her pink Adderall pills did.  Patient reports that she does not feel any effect to her current Adderall prescription.  She denies receiving the burst of energy or focus that she used to experience when taking her pink Adderall pills.  Since taking her adjusted dosage of Zoloft, patient states that she has not experienced any beneficial changes to her mood.  She does deny experiencing any severe side effects with her newly adjusted dosage.  Patient rates her depression a 9 out of 10 with 10 being most severe.  Patient endorses depressive symptoms every day with symptoms lasting the whole day.  Patient endorses the following depressive symptoms: decreased energy, lack of motivation, and low mood.  Patient reports that she is physically unable to do anything and is unproductive most of the day.  She reports that it takes too much energy and effort to do the simplest of task.  Although patient endorses worsening depression, she  states her anxiety has been manageable.  She states that her anxiety is still present but not as bad as before.  Patient is alert and oriented x 4, calm, cooperative, and fully engaged in conversation during the encounter.  Patient endorses okay mood.  Patient denies suicidal or homicidal ideations.  She further denies auditory or visual hallucinations and does not appear to be responding to internal/external stimuli.  Patient endorses receiving too much sleep.  She states that the amount of sleep is often not in one stretch.  She reports that she often has multiple sleeps within a 24-hour day.  Patient endorses eating 2-3 times per day but states that the food she eats are not always meals.  Patient denies alcohol consumption, tobacco use, and illicit drug use.  Visit Diagnosis:    ICD-10-CM   1. Anxiety state  F41.1 sertraline (ZOLOFT) 50 MG tablet    2. Moderate episode of recurrent major depressive disorder (HCC)  F33.1 ARIPiprazole (ABILIFY) 5 MG tablet    sertraline (ZOLOFT) 50 MG tablet    3. Attention deficit disorder, unspecified hyperactivity presence  F98.8 amphetamine-dextroamphetamine (ADDERALL) 10 MG tablet      Past Psychiatric History:  Patient has a past psychiatric history significant for depression, anxiety, and ADD (unspecified hyperactivity present)   Patient denies a past history of hospitalization due to mental health   Patient denies a past history of suicide attempt Patient denies a past history of homicide attempt  Past Medical History:  Past Medical History:  Diagnosis Date   Anemia    H/O   Hx of migraines    IBS (irritable  bowel syndrome)     Past Surgical History:  Procedure Laterality Date   FOOT SURGERY Right    WISDOM TOOTH EXTRACTION      Family Psychiatric History:  Patient is unsure of family history of psychiatric illness   Family history of suicide attempt: Patient denies Family history of homicide: Patient denies Family history of  substance abuse: Patient endorses a family history of substance abuse but is unsure of the details  Family History:  Family History  Problem Relation Age of Onset   Diabetes Father    Hypertension Mother    Lupus Cousin     Social History:  Social History   Socioeconomic History   Marital status: Single    Spouse name: Not on file   Number of children: 0   Years of education: Not on file   Highest education level: Some college, no degree  Occupational History   Not on file  Tobacco Use   Smoking status: Never   Smokeless tobacco: Never  Vaping Use   Vaping Use: Never used  Substance and Sexual Activity   Alcohol use: No   Drug use: No   Sexual activity: Yes    Birth control/protection: Condom  Other Topics Concern   Not on file  Social History Narrative   Right handed, lives in an apartment on 2nd floor alone.   Pt drinks coffee, occasionally hot tea, soda sometimes, water   No children   Does not exercises regularly    Social Determinants of Health   Financial Resource Strain: Not on file  Food Insecurity: Not on file  Transportation Needs: Not on file  Physical Activity: Not on file  Stress: Not on file  Social Connections: Not on file    Allergies:  Allergies  Allergen Reactions   Bupropion Hives   Metformin Nausea Only   Oxycodone-Acetaminophen Itching   Tramadol Itching   Codeine    Oxycodone    Tape     Adhesive allergy     Metabolic Disorder Labs: No results found for: "HGBA1C", "MPG" No results found for: "PROLACTIN" No results found for: "CHOL", "TRIG", "HDL", "CHOLHDL", "VLDL", "LDLCALC" Lab Results  Component Value Date   TSH 3.45 08/03/2022   TSH 2.38 09/01/2019    Therapeutic Level Labs: No results found for: "LITHIUM" No results found for: "VALPROATE" No results found for: "CBMZ"  Current Medications: Current Outpatient Medications  Medication Sig Dispense Refill   ARIPiprazole (ABILIFY) 5 MG tablet Take 1 tablet (5 mg  total) by mouth daily. 30 tablet 1   amphetamine-dextroamphetamine (ADDERALL) 10 MG tablet Take 1 tablet (10 mg total) by mouth daily. 30 tablet 0   fluticasone (FLONASE) 50 MCG/ACT nasal spray Place 2 sprays into the nose daily.     loratadine (CLARITIN) 10 MG tablet Take 10 mg by mouth daily.     naproxen sodium (ALEVE) 220 MG tablet Take 220 mg by mouth as needed.     nystatin-triamcinolone (MYCOLOG II) cream Apply 1 Application topically 2 (two) times daily. 30 g 0   Probiotic Product (PROBIOTIC DAILY PO) Take by mouth.     sertraline (ZOLOFT) 50 MG tablet Take 3 tablets (150 mg total) by mouth daily. 90 tablet 1   SUMAtriptan (IMITREX) 100 MG tablet Take 1 tablet earliest onset of migraine.  May repeat in 2 hours if headache persists or recurs.  Maximum 2 tablets in 24 hours 10 tablet 3   Ubrogepant (UBRELVY) 100 MG TABS Take 100 mg by mouth  as needed (May repeat dose in 2 hours.  Maximum 2 tablets in 24 hours.). 10 tablet 5   No current facility-administered medications for this visit.     Musculoskeletal: Strength & Muscle Tone: within normal limits Gait & Station: normal Patient leans: N/A  Psychiatric Specialty Exam: Review of Systems  Psychiatric/Behavioral:  Positive for decreased concentration and sleep disturbance. Negative for dysphoric mood, hallucinations, self-injury and suicidal ideas. The patient is nervous/anxious. The patient is not hyperactive.     There were no vitals taken for this visit.There is no height or weight on file to calculate BMI.  General Appearance: Casual  Eye Contact:  Good  Speech:  Clear and Coherent and Normal Rate  Volume:  Normal  Mood:  Anxious and Depressed  Affect:  Appropriate  Thought Process:  Coherent, Goal Directed, and Descriptions of Associations: Intact  Orientation:  Full (Time, Place, and Person)  Thought Content: WDL   Suicidal Thoughts:  No  Homicidal Thoughts:  No  Memory:  Immediate;   Good Recent;   Good Remote;    Good  Judgement:  Good  Insight:  Fair  Psychomotor Activity:  Normal  Concentration:  Concentration: Good and Attention Span: Good  Recall:  Good  Fund of Knowledge: Good  Language: Good  Akathisia:  No  Handed:  Right  AIMS (if indicated): not done  Assets:  Communication Skills Desire for Improvement Financial Resources/Insurance Housing Physical Health Social Support Transportation  ADL's:  Intact  Cognition: WNL  Sleep:  Fair   Screenings: GAD-7    Flowsheet Row Clinical Support from 12/13/2022 in [facility] Clinical Support from 10/31/2022 in [facility] Office Visit from 09/19/2022 in [facility]  Total GAD-7 Score 10 17 20       PHQ2-9    Flowsheet Row Clinical Support from 12/13/2022 in [facility] Clinical Support from 10/31/2022 in [facility] Office Visit from 09/19/2022 in [facility] Office Visit from 01/20/2021 in [facility] Office Visit from 08/25/2018 in [facility]  PHQ-2 Total Score 6 6 6 6 5   PHQ-9 Total Score 19 21 22 24 19       Flowsheet Row Clinical Support from 12/13/2022 in [facility] Clinical Support from 10/31/2022 in [facility] Office Visit from 09/19/2022 in [facility]  C-SSRS RISK CATEGORY No Risk No Risk Low Risk        Assessment and Plan:   Kelly Walsh is a 42 year old, African-American female with a past psychiatric history significant for attention deficit disorder (unspecified hyperactivity present), anxiety, and major depressive disorder who presents to [facility] for follow-up and medication management.  Patient continues to endorse worsening depression that has not  changed since the adjustment of her Zoloft.  She does report that her anxiety has been manageable since taking Zoloft.  Patient also notes that her prescription of Adderall seems different.  She reports that her current prescription of Adderall have blue pills and states that they have not been helpful in managing her focus or giving her energy.  In regards to her depression, provider recommended adjusting patient's Zoloft from 100 mg to 150 mg daily for the management of her depressive symptoms and anxiety.  Provider also suggested patient be placed on Abilify 5 mg daily as an  adjunct to the management of her depressive symptoms.  Patient was agreeable to recommendations.  Collaboration of Care: Collaboration of Care: Medication Management AEB provider managing patient's psychiatric medications and Psychiatrist AEB patient being followed by a mental health provider  Patient/Guardian was advised Release of Information must be obtained prior to any record release in order to collaborate their care with an outside provider. Patient/Guardian was advised if they have not already done so to contact the registration department to sign all necessary forms in order for [place] to release information regarding their care.   Consent: Patient/Guardian gives verbal consent for treatment and assignment of benefits for services provided during this visit. Patient/Guardian expressed understanding and agreed to proceed.   1. Anxiety state  - sertraline (ZOLOFT) 50 MG tablet; Take 3 tablets (150 mg total) by mouth daily.  Dispense: 90 tablet; Refill: 1  2. Moderate episode of recurrent major depressive disorder (HCC)  - ARIPiprazole (ABILIFY) 5 MG tablet; Take 1 tablet (5 mg total) by mouth daily.  Dispense: 30 tablet; Refill: 1 - sertraline (ZOLOFT) 50 MG tablet; Take 3 tablets (150 mg total) by mouth daily.  Dispense: 90 tablet; Refill: 1  3. Attention deficit disorder, unspecified hyperactivity presence  -  amphetamine-dextroamphetamine (ADDERALL) 10 MG tablet; Take 1 tablet (10 mg total) by mouth daily.  Dispense: 30 tablet; Refill: 0  Patient to follow up in 6 weeks Provider spent a total of 25 minutes with the patient/reviewing the patient's chart  Meta Hatchet, PA 12/16/2022, 12:13 PM

## 2023-01-24 ENCOUNTER — Ambulatory Visit (HOSPITAL_COMMUNITY): Payer: Medicaid Other | Admitting: Physician Assistant

## 2023-01-24 DIAGNOSIS — F411 Generalized anxiety disorder: Secondary | ICD-10-CM

## 2023-01-24 DIAGNOSIS — F988 Other specified behavioral and emotional disorders with onset usually occurring in childhood and adolescence: Secondary | ICD-10-CM | POA: Diagnosis not present

## 2023-01-24 DIAGNOSIS — F331 Major depressive disorder, recurrent, moderate: Secondary | ICD-10-CM

## 2023-01-24 MED ORDER — AMPHETAMINE-DEXTROAMPHETAMINE 10 MG PO TABS: 10.0000 mg | ORAL_TABLET | Freq: Two times a day (BID) | ORAL | 0 refills | Status: DC

## 2023-01-24 MED ORDER — ARIPIPRAZOLE 5 MG PO TABS: 5.0000 mg | ORAL_TABLET | Freq: Every day | ORAL | 1 refills | Status: DC

## 2023-01-24 MED ORDER — SERTRALINE HCL 50 MG PO TABS: 150.0000 mg | ORAL_TABLET | Freq: Every day | ORAL | 1 refills | Status: DC

## 2023-01-25 ENCOUNTER — Encounter (HOSPITAL_COMMUNITY): Payer: Self-pay | Admitting: Physician Assistant

## 2023-01-25 NOTE — Progress Notes (Unsigned)
BH MD/PA/NP OP Progress Note  01/25/2023 5:40 PM Kelly Walsh  MRN:  [id number]  Chief Complaint:  Chief Complaint  Patient presents with   Follow-up   Medication Refill   HPI:   Kelly Walsh is a 42 year old, African-American female with a past psychiatric history significant for attention deficit disorder (unspecified hyperactivity present), anxiety, and major depressive disorder who presents to [facility] for follow-up and medication management.  Patient is currently being managed on the following psychiatric medication:   Zoloft 150 mg daily Abilify 5 mg daily Amphetamine-dextroamphetamine (Adderall) 10 mg daily  Patient presents today encounter stating that she experienced side effects from the previous adjustments to her medications.  In regards to what she is experiencing, patient reports that she still continues to struggle to do regular activities such as cleaning, cooking, or running errands.  Patient also reports that she has no motivation to do anything, although she does have the desire.  Patient also notes that she does not have any concentration and experiences brain fog constantly.  Patient endorses minimal energy, but states that the energy she does have is more energy than before, but not enough to get stuff done.  In regards to what is working with her current medication regimen, patient states that her medications helped alleviate her body aches: However, she does report having some body aches today.  Patient also experiences was hopelessness and guilt.  She reports having a desire to do things but lacks the ability to do those activities.  She does report that she has had the desire to text and call people, which is not normal for her.  Patient reports that her anxiety is elevated and often experiences racing and intrusive thoughts with her anxiety.  Patient notes that she has also experienced being impatient about things.   She feels like everything that she must do feels urgent or emergent.  Patient endorses depression and rates her depression as 7 out of 10 with 10 being most severe.  Patient endorses depressive episodes every day, all day.  Patient's depressive episodes are characterized by the following: lack of motivation, and decreased concentration.  Patient denies irritability and has noticed getting out more and playing with her pets.  Patient endorses anxiety and rates her anxiety as 7 out of 10.  Patient denies any new stressors at this time.  A PHQ-9 screen was performed with the patient scoring a 22.  A GAD-7 screen was also performed with the patient scoring a 14.  Patient is alert and oriented x 4, calm, cooperative, and fully engaged in conversation during the encounter.  Patient reports that she is tired and did not want to leave the house today.  Other than not wanting to leave the house, patient reports that she is fine.  Patient denies suicidal or homicidal ideations.  She further denies auditory or visual hallucinations and does not appear to be responding to internal/external stimuli.  Patient reports that her sleep has been messed up as of late.  Patient reports that she is sleeping 2 to 3 hours at a time as well as staying up for a while before going to sleep.  Patient endorses increased appetite stating that she is snacking a lot but has at least 2 meals per day.  Patient denies alcohol consumption, tobacco use, or illicit drug use.  Visit Diagnosis:    ICD-10-CM   1. Moderate episode of recurrent major depressive disorder (HCC)  F33.1 ARIPiprazole (ABILIFY) 5  MG tablet    sertraline (ZOLOFT) 50 MG tablet    2. Anxiety state  F41.1 sertraline (ZOLOFT) 50 MG tablet    3. Attention deficit disorder, unspecified hyperactivity presence  F98.8 amphetamine-dextroamphetamine (ADDERALL) 10 MG tablet      Past Psychiatric History:  Patient has a past psychiatric history significant for depression,  anxiety, and ADD (unspecified hyperactivity present)   Patient denies a past history of hospitalization due to mental health   Patient denies a past history of suicide attempt Patient denies a past history of homicide attempt  Past Medical History:  Past Medical History:  Diagnosis Date   Anemia    H/O   Hx of migraines    IBS (irritable bowel syndrome)     Past Surgical History:  Procedure Laterality Date   FOOT SURGERY Right    WISDOM TOOTH EXTRACTION      Family Psychiatric History:  Patient is unsure of family history of psychiatric illness   Family history of suicide attempt: Patient denies Family history of homicide: Patient denies Family history of substance abuse: Patient endorses a family history of substance abuse but is unsure of the details  Family History:  Family History  Problem Relation Age of Onset   Diabetes Father    Hypertension Mother    Lupus Cousin     Social History:  Social History   Socioeconomic History   Marital status: Single    Spouse name: Not on file   Number of children: 0   Years of education: Not on file   Highest education level: Some college, no degree  Occupational History   Not on file  Tobacco Use   Smoking status: Never   Smokeless tobacco: Never  Vaping Use   Vaping status: Never Used  Substance and Sexual Activity   Alcohol use: No   Drug use: No   Sexual activity: Yes    Birth control/protection: Condom  Other Topics Concern   Not on file  Social History Narrative   Right handed, lives in an apartment on 2nd floor alone.   Pt drinks coffee, occasionally hot tea, soda sometimes, water   No children   Does not exercises regularly    Social Determinants of Health   Financial Resource Strain: Not on file  Food Insecurity: Not on file  Transportation Needs: Not on file  Physical Activity: Not on file  Stress: Not on file  Social Connections: Not on file    Allergies:  Allergies  Allergen Reactions    Bupropion Hives   Metformin Nausea Only   Oxycodone-Acetaminophen Itching   Tramadol Itching   Codeine    Oxycodone    Tape     Adhesive allergy     Metabolic Disorder Labs: No results found for: "HGBA1C", "MPG" No results found for: "PROLACTIN" No results found for: "CHOL", "TRIG", "HDL", "CHOLHDL", "VLDL", "LDLCALC" Lab Results  Component Value Date   TSH 3.45 08/03/2022   TSH 2.38 09/01/2019    Therapeutic Level Labs: No results found for: "LITHIUM" No results found for: "VALPROATE" No results found for: "CBMZ"  Current Medications: Current Outpatient Medications  Medication Sig Dispense Refill   amphetamine-dextroamphetamine (ADDERALL) 10 MG tablet Take 1 tablet (10 mg total) by mouth 2 (two) times daily with a meal. 60 tablet 0   ARIPiprazole (ABILIFY) 5 MG tablet Take 1 tablet (5 mg total) by mouth daily. 30 tablet 1   fluticasone (FLONASE) 50 MCG/ACT nasal spray Place 2 sprays into the nose daily.  loratadine (CLARITIN) 10 MG tablet Take 10 mg by mouth daily.     naproxen sodium (ALEVE) 220 MG tablet Take 220 mg by mouth as needed.     nystatin-triamcinolone (MYCOLOG II) cream Apply 1 Application topically 2 (two) times daily. 30 g 0   Probiotic Product (PROBIOTIC DAILY PO) Take by mouth.     sertraline (ZOLOFT) 50 MG tablet Take 3 tablets (150 mg total) by mouth daily. 90 tablet 1   SUMAtriptan (IMITREX) 100 MG tablet Take 1 tablet earliest onset of migraine.  May repeat in 2 hours if headache persists or recurs.  Maximum 2 tablets in 24 hours 10 tablet 3   Ubrogepant (UBRELVY) 100 MG TABS Take 100 mg by mouth as needed (May repeat dose in 2 hours.  Maximum 2 tablets in 24 hours.). 10 tablet 5   No current facility-administered medications for this visit.     Musculoskeletal: Strength & Muscle Tone: within normal limits Gait & Station: normal Patient leans: N/A  Psychiatric Specialty Exam: Review of Systems  Psychiatric/Behavioral:  Positive for decreased  concentration and sleep disturbance. Negative for dysphoric mood, hallucinations, self-injury and suicidal ideas. The patient is nervous/anxious. The patient is not hyperactive.     There were no vitals taken for this visit.There is no height or weight on file to calculate BMI.  General Appearance: Casual  Eye Contact:  Good  Speech:  Clear and Coherent and Normal Rate  Volume:  Normal  Mood:  Anxious and Depressed  Affect:  Appropriate  Thought Process:  Coherent, Goal Directed, and Descriptions of Associations: Intact  Orientation:  Full (Time, Place, and Person)  Thought Content: WDL   Suicidal Thoughts:  No  Homicidal Thoughts:  No  Memory:  Immediate;   Good Recent;   Good Remote;   Good  Judgement:  Good  Insight:  Fair  Psychomotor Activity:  Normal  Concentration:  Concentration: Good and Attention Span: Good  Recall:  Good  Fund of Knowledge: Good  Language: Good  Akathisia:  No  Handed:  Right  AIMS (if indicated): not done  Assets:  Communication Skills Desire for Improvement Financial Resources/Insurance Housing Physical Health Social Support Transportation  ADL's:  Intact  Cognition: WNL  Sleep:  Fair   Screenings: GAD-7    Flowsheet Row Clinical Support from 01/24/2023 in [facility] Clinical Support from 12/13/2022 in [facility] Clinical Support from 10/31/2022 in [facility] Office Visit from 09/19/2022 in [facility]  Total GAD-7 Score 14 10 17 20       PHQ2-9    Flowsheet Row Clinical Support from 01/24/2023 in [facility] Clinical Support from 12/13/2022 in [facility] Clinical Support from 10/31/2022 in [facility] Office Visit from 09/19/2022 in [facility] Office Visit from 01/20/2021 in [facility]  PHQ-2 Total Score 5 6 6 6 6   PHQ-9 Total Score 22 19 21 22 24       Flowsheet Row Clinical Support from 01/24/2023 in [facility] Clinical Support from 12/13/2022 in [facility] Clinical Support from 10/31/2022 in [facility]  C-SSRS RISK CATEGORY No Risk No Risk No Risk        Assessment and Plan:   Kelly Walsh is a 42 year old, African-American female with a past psychiatric history significant  for attention deficit disorder (unspecified hyperactivity present), anxiety, and major depressive disorder who presents to [facility] for follow-up and medication management.  Patient reports that she has been experiencing some issues since the recent adjustments to her medications, patient reports that she still struggling to do regular activities such as cleaning, cooking, or running errands.  Patient also notes that she does not have any motivation to do anything but has the desire.  Patient also reports having no concentration and experiencing brain fog regularly.  Overall, patient endorses ongoing depression that occurs every day characterized by lack of motivation and decreased concentration.  She also continues to endorse anxiety as well as issues with sleep.  Although patient continues to endorse ongoing depression and anxiety, she does state that she has experienced more energy and feeling less irritable characterized by playing with her pets more and calling and texting friends.  Patient is open to continuing to take her medications as prescribed to see if she experiences any improvements in her symptoms.  Patient's medications to be e-prescribed to pharmacy of choice  Collaboration of Care: Collaboration of Care: Medication Management AEB provider managing patient's psychiatric medications and Psychiatrist AEB patient being followed by a mental  health provider  Patient/Guardian was advised Release of Information must be obtained prior to any record release in order to collaborate their care with an outside provider. Patient/Guardian was advised if they have not already done so to contact the registration department to sign all necessary forms in order for [place] to release information regarding their care.   Consent: Patient/Guardian gives verbal consent for treatment and assignment of benefits for services provided during this visit. Patient/Guardian expressed understanding and agreed to proceed.   1. Moderate episode of recurrent major depressive disorder (HCC)  - ARIPiprazole (ABILIFY) 5 MG tablet; Take 1 tablet (5 mg total) by mouth daily.  Dispense: 30 tablet; Refill: 1 - sertraline (ZOLOFT) 50 MG tablet; Take 3 tablets (150 mg total) by mouth daily.  Dispense: 90 tablet; Refill: 1  2. Anxiety state  - sertraline (ZOLOFT) 50 MG tablet; Take 3 tablets (150 mg total) by mouth daily.  Dispense: 90 tablet; Refill: 1  3. Attention deficit disorder, unspecified hyperactivity presence  - amphetamine-dextroamphetamine (ADDERALL) 10 MG tablet; Take 1 tablet (10 mg total) by mouth 2 (two) times daily with a meal.  Dispense: 60 tablet; Refill: 0   Patient to follow up in 6 weeks Provider spent a total of 37 minutes with the patient/reviewing the patient's chart  Meta Hatchet, PA 01/25/2023, 5:40 PM

## 2023-03-07 ENCOUNTER — Ambulatory Visit (INDEPENDENT_AMBULATORY_CARE_PROVIDER_SITE_OTHER): Payer: Medicaid Other | Admitting: Physician Assistant

## 2023-03-07 ENCOUNTER — Encounter (HOSPITAL_COMMUNITY): Payer: Self-pay | Admitting: Physician Assistant

## 2023-03-07 DIAGNOSIS — F988 Other specified behavioral and emotional disorders with onset usually occurring in childhood and adolescence: Secondary | ICD-10-CM

## 2023-03-07 DIAGNOSIS — F411 Generalized anxiety disorder: Secondary | ICD-10-CM | POA: Diagnosis not present

## 2023-03-07 DIAGNOSIS — F331 Major depressive disorder, recurrent, moderate: Secondary | ICD-10-CM | POA: Diagnosis not present

## 2023-03-07 MED ORDER — SERTRALINE HCL 50 MG PO TABS: 150.0000 mg | ORAL_TABLET | Freq: Every day | ORAL | 1 refills | Status: DC

## 2023-03-07 MED ORDER — ARIPIPRAZOLE 10 MG PO TABS: 10.0000 mg | ORAL_TABLET | Freq: Every day | ORAL | 1 refills | Status: DC

## 2023-03-07 MED ORDER — AMPHETAMINE-DEXTROAMPHETAMINE 10 MG PO TABS: 10.0000 mg | ORAL_TABLET | Freq: Two times a day (BID) | ORAL | 0 refills | Status: DC

## 2023-03-07 NOTE — Progress Notes (Signed)
BH MD/PA/NP OP Progress Note  03/07/2023 8:45 PM Kelly Walsh  MRN:  [id number]  Chief Complaint:  Chief Complaint  Patient presents with   Follow-up   Medication Management   HPI:   Kelly Walsh is a 42 year old, African-American female with a past psychiatric history significant for attention deficit disorder (unspecified hyperactivity present), anxiety, and major depressive disorder who presents to [facility] for follow-up and medication management.  Patient is currently being managed on the following psychiatric medication:   Zoloft 150 mg daily Abilify 5 mg daily Amphetamine-dextroamphetamine (Adderall) 10 mg daily  Since being placed on her current medication regimen, patient states that she feels better in certain aspects of her life.  She reports that she is still exhausted and still does not feel like she is functioning like a regular person.  She states that she has a desire to get things done but lacks the follow-through.  Out of all of her medications, patient believes that her Abilify has been most helpful in the improvement of her symptoms.  She states that her Zoloft has been helping more so with her anxiety.  Patient continues to endorse depression and rates her depression as 6 out of 10 with 10 being most severe.  Patient states that although she feels a lot better, she does not feel like she is at baseline.  Patient endorses depressive episodes occurring every day; however, symptoms do not usually last the whole day.  Patient endorses the following depressive symptoms: decreased energy, lethargy upon waking up, and lack of motivation. Patient endorses anxiety and rates her anxiety a 5 out of 10 , but denies any new stressors.  Patient reports that her main issues are her ongoing brain fog, lack of focus, and decreased energy.  Patient states that she wants to get to the point of having enough energy to do something all day.  A PHQ-9  screen was performed with the patient scoring a 16.  A GAD-7 screen was also performed with the patient scoring a 15.  Patient is alert and oriented x 4, calm, cooperative, and fully engaged in conversation during the encounter.  Patient endorses fine mood.  Patient denies suicidal or homicidal ideations.  She further denies auditory or visual hallucinations and does not appear to be responding to internal/external stimuli.  Patient endorses fair sleep and receives on average 4 to 6 hours of sleep per night.  Patient endorses good appetite and eats on average 3 meals per day.  Patient denies alcohol consumption, tobacco use, or illicit drug use.  Visit Diagnosis:    ICD-10-CM   1. Moderate episode of recurrent major depressive disorder (HCC)  F33.1 ARIPiprazole (ABILIFY) 10 MG tablet    sertraline (ZOLOFT) 50 MG tablet    2. Anxiety state  F41.1 sertraline (ZOLOFT) 50 MG tablet    3. Attention deficit disorder, unspecified hyperactivity presence  F98.8 amphetamine-dextroamphetamine (ADDERALL) 10 MG tablet       Past Psychiatric History:  Patient has a past psychiatric history significant for depression, anxiety, and ADD (unspecified hyperactivity present)   Patient denies a past history of hospitalization due to mental health   Patient denies a past history of suicide attempt Patient denies a past history of homicide attempt  Past Medical History:  Past Medical History:  Diagnosis Date   Anemia    H/O   Hx of migraines    IBS (irritable bowel syndrome)     Past Surgical History:  Procedure  Laterality Date   FOOT SURGERY Right    WISDOM TOOTH EXTRACTION      Family Psychiatric History:  Patient is unsure of family history of psychiatric illness   Family history of suicide attempt: Patient denies Family history of homicide: Patient denies Family history of substance abuse: Patient endorses a family history of substance abuse but is unsure of the details  Family History:   Family History  Problem Relation Age of Onset   Diabetes Father    Hypertension Mother    Lupus Cousin     Social History:  Social History   Socioeconomic History   Marital status: Single    Spouse name: Not on file   Number of children: 0   Years of education: Not on file   Highest education level: Some college, no degree  Occupational History   Not on file  Tobacco Use   Smoking status: Never   Smokeless tobacco: Never  Vaping Use   Vaping status: Never Used  Substance and Sexual Activity   Alcohol use: No   Drug use: No   Sexual activity: Yes    Birth control/protection: Condom  Other Topics Concern   Not on file  Social History Narrative   Right handed, lives in an apartment on 2nd floor alone.   Pt drinks coffee, occasionally hot tea, soda sometimes, water   No children   Does not exercises regularly    Social Determinants of Health   Financial Resource Strain: Not on file  Food Insecurity: Not on file  Transportation Needs: Not on file  Physical Activity: Not on file  Stress: Not on file  Social Connections: Not on file    Allergies:  Allergies  Allergen Reactions   Bupropion Hives   Metformin Nausea Only   Oxycodone-Acetaminophen Itching   Tramadol Itching   Codeine    Oxycodone    Tape     Adhesive allergy     Metabolic Disorder Labs: No results found for: "HGBA1C", "MPG" No results found for: "PROLACTIN" No results found for: "CHOL", "TRIG", "HDL", "CHOLHDL", "VLDL", "LDLCALC" Lab Results  Component Value Date   TSH 3.45 08/03/2022   TSH 2.38 09/01/2019    Therapeutic Level Labs: No results found for: "LITHIUM" No results found for: "VALPROATE" No results found for: "CBMZ"  Current Medications: Current Outpatient Medications  Medication Sig Dispense Refill   amphetamine-dextroamphetamine (ADDERALL) 10 MG tablet Take 1 tablet (10 mg total) by mouth 2 (two) times daily with a meal. 60 tablet 0   ARIPiprazole (ABILIFY) 10 MG tablet  Take 1 tablet (10 mg total) by mouth daily. 30 tablet 1   fluticasone (FLONASE) 50 MCG/ACT nasal spray Place 2 sprays into the nose daily.     loratadine (CLARITIN) 10 MG tablet Take 10 mg by mouth daily.     naproxen sodium (ALEVE) 220 MG tablet Take 220 mg by mouth as needed.     nystatin-triamcinolone (MYCOLOG II) cream Apply 1 Application topically 2 (two) times daily. 30 g 0   Probiotic Product (PROBIOTIC DAILY PO) Take by mouth.     sertraline (ZOLOFT) 50 MG tablet Take 3 tablets (150 mg total) by mouth daily. 90 tablet 1   SUMAtriptan (IMITREX) 100 MG tablet Take 1 tablet earliest onset of migraine.  May repeat in 2 hours if headache persists or recurs.  Maximum 2 tablets in 24 hours 10 tablet 3   Ubrogepant (UBRELVY) 100 MG TABS Take 100 mg by mouth as needed (May repeat dose  in 2 hours.  Maximum 2 tablets in 24 hours.). 10 tablet 5   No current facility-administered medications for this visit.     Musculoskeletal: Strength & Muscle Tone: within normal limits Gait & Station: normal Patient leans: N/A  Psychiatric Specialty Exam: Review of Systems  Psychiatric/Behavioral:  Positive for decreased concentration and sleep disturbance. Negative for dysphoric mood, hallucinations, self-injury and suicidal ideas. The patient is nervous/anxious. The patient is not hyperactive.     Blood pressure 126/79, pulse (!) 115, temperature 98.3 F (36.8 C), temperature source Oral, height 5\' 5"  (1.651 m), weight 189 lb (85.7 kg), SpO2 99%.Body mass index is 31.45 kg/m.  General Appearance: Casual  Eye Contact:  Good  Speech:  Clear and Coherent and Normal Rate  Volume:  Normal  Mood:  Anxious and Depressed  Affect:  Appropriate  Thought Process:  Coherent, Goal Directed, and Descriptions of Associations: Intact  Orientation:  Full (Time, Place, and Person)  Thought Content: WDL   Suicidal Thoughts:  No  Homicidal Thoughts:  No  Memory:  Immediate;   Good Recent;   Good Remote;   Good   Judgement:  Good  Insight:  Fair  Psychomotor Activity:  Normal  Concentration:  Concentration: Good and Attention Span: Good  Recall:  Good  Fund of Knowledge: Good  Language: Good  Akathisia:  No  Handed:  Right  AIMS (if indicated): not done  Assets:  Communication Skills Desire for Improvement Financial Resources/Insurance Housing Physical Health Social Support Transportation  ADL's:  Intact  Cognition: WNL  Sleep:  Fair   Screenings: GAD-7    Flowsheet Row Clinical Support from 03/07/2023 in [facility] Clinical Support from 01/24/2023 in [facility] Clinical Support from 12/13/2022 in [facility] Clinical Support from 10/31/2022 in [facility] Office Visit from 09/19/2022 in [facility]  Total GAD-7 Score 15 14 10 17 20       PHQ2-9    Flowsheet Row Clinical Support from 03/07/2023 in [facility] Clinical Support from 01/24/2023 in [facility] Clinical Support from 12/13/2022 in [facility] Clinical Support from 10/31/2022 in [facility] Office Visit from 09/19/2022 in [facility]  PHQ-2 Total Score 3 5 6 6 6   PHQ-9 Total Score 16 22 19 21 22       Flowsheet Row Clinical Support from 03/07/2023 in [facility] Clinical Support from 01/24/2023 in [facility] Clinical Support from 12/13/2022 in [facility]  C-SSRS RISK CATEGORY No Risk No Risk No Risk        Assessment and Plan:   Kelly Walsh is a 42 year old, African-American female with a past psychiatric history significant for attention deficit disorder (unspecified hyperactivity present), anxiety, and major depressive disorder who presents to [facility] for follow-up and medication management.  Patient presents today encounter stating that certain aspects in her life have improved since being on her current medication regimen.  Out of all of her medications, patient reports that her Abilify has been the most helpful in the improvements of her symptoms.  Patient continues to endorse ongoing depression characterized by decreased energy, lethargy, and lack of motivation.  Patient also endorses anxiety but denies any new stressors at this time.  Patient also notes that she has been having issues with  brain fog and focus.  Provider recommended increasing patient's Abilify dosage from 5 mg to 10 mg daily for the management of her depressive symptoms and for mood stability.  Patient was agreeable to recommendation.  Patient's medications to be e-prescribed to pharmacy of choice.  Collaboration of Care: Collaboration of Care: Medication Management AEB provider managing patient's psychiatric medications and Psychiatrist AEB patient being followed by a mental health provider  Patient/Guardian was advised Release of Information must be obtained prior to any record release in order to collaborate their care with an outside provider. Patient/Guardian was advised if they have not already done so to contact the registration department to sign all necessary forms in order for [place] to release information regarding their care.   Consent: Patient/Guardian gives verbal consent for treatment and assignment of benefits for services provided during this visit. Patient/Guardian expressed understanding and agreed to proceed.   1. Moderate episode of recurrent major depressive disorder (HCC)  - ARIPiprazole (ABILIFY) 10 MG tablet; Take 1 tablet (10 mg total) by mouth daily.  Dispense: 30 tablet; Refill: 1 - sertraline (ZOLOFT) 50 MG tablet; Take 3 tablets (150 mg total) by mouth daily.  Dispense: 90 tablet; Refill: 1  2. Anxiety  state  - sertraline (ZOLOFT) 50 MG tablet; Take 3 tablets (150 mg total) by mouth daily.  Dispense: 90 tablet; Refill: 1  3. Attention deficit disorder, unspecified hyperactivity presence  - amphetamine-dextroamphetamine (ADDERALL) 10 MG tablet; Take 1 tablet (10 mg total) by mouth 2 (two) times daily with a meal.  Dispense: 60 tablet; Refill: 0  Patient to follow up in 6 weeks Provider spent a total of 27 minutes with the patient/reviewing the patient's chart  Meta Hatchet, PA 03/07/2023, 8:45 PM

## 2023-03-21 ENCOUNTER — Encounter: Payer: Self-pay | Admitting: Family

## 2023-03-22 ENCOUNTER — Other Ambulatory Visit: Payer: Self-pay | Admitting: Family

## 2023-03-22 DIAGNOSIS — M549 Dorsalgia, unspecified: Secondary | ICD-10-CM

## 2023-04-02 ENCOUNTER — Other Ambulatory Visit: Payer: Self-pay

## 2023-04-02 DIAGNOSIS — E0590 Thyrotoxicosis, unspecified without thyrotoxic crisis or storm: Secondary | ICD-10-CM

## 2023-04-03 ENCOUNTER — Other Ambulatory Visit (INDEPENDENT_AMBULATORY_CARE_PROVIDER_SITE_OTHER): Payer: Medicaid Other

## 2023-04-03 DIAGNOSIS — E0590 Thyrotoxicosis, unspecified without thyrotoxic crisis or storm: Secondary | ICD-10-CM

## 2023-04-03 LAB — T3, FREE: T3, Free: 4 pg/mL (ref 2.3–4.2)

## 2023-04-03 LAB — T4, FREE: Free T4: 0.74 ng/dL (ref 0.60–1.60)

## 2023-04-03 LAB — TSH: TSH: 5.72 u[IU]/mL — ABNORMAL HIGH (ref 0.35–5.50)

## 2023-04-08 LAB — THYROID STIMULATING IMMUNOGLOBULIN: TSI: <89 % baseline (ref <140)

## 2023-04-08 LAB — TRAB (TSH RECEPTOR BINDING ANTIBODY): TRAB: 1.21 [IU]/L (ref <=2.00)

## 2023-04-15 ENCOUNTER — Encounter: Payer: Self-pay | Admitting: "Endocrinology

## 2023-04-15 ENCOUNTER — Ambulatory Visit (INDEPENDENT_AMBULATORY_CARE_PROVIDER_SITE_OTHER): Payer: Medicaid Other | Admitting: "Endocrinology

## 2023-04-15 VITALS — BP 130/80 | HR 108 | Ht 65.0 in | Wt 196.2 lb

## 2023-04-15 DIAGNOSIS — E038 Other specified hypothyroidism: Secondary | ICD-10-CM | POA: Diagnosis not present

## 2023-04-15 DIAGNOSIS — E010 Iodine-deficiency related diffuse (endemic) goiter: Secondary | ICD-10-CM

## 2023-04-15 MED ORDER — LEVOTHYROXINE SODIUM 25 MCG PO TABS: 25.0000 ug | ORAL_TABLET | Freq: Every day | ORAL | 3 refills | Status: AC

## 2023-04-15 NOTE — Progress Notes (Signed)
Outpatient Endocrinology Note Altamese Lockridge, MD  04/15/23   Kelly Walsh 03/12/1981 [id number]  Referring Provider: Edwinna Areola, * Primary Care Provider: Olive Bass, FNP Subjective  No chief complaint on file.   Assessment & Plan  Diagnoses and all orders for this visit:  Subclinical hypothyroidism -     TSH Rfx on Abnormal to Free T4; Future  Thyromegaly  Other orders -     levothyroxine (SYNTHROID) 25 MCG tablet; Take 1 tablet (25 mcg total) by mouth daily.    Kelly Walsh is currently not taking any thyroid medication.  Patient is currently biochemically subclinically hypothyroid.  Educated on thyroid axis.  Recommend the following: Take levothyroxine 25 every morning.  Advised to take levothyroxine first thing in the morning on empty stomach and wait at least 30 minutes to 1 hour before eating or drinking anything or taking any other medications. Space out levothyroxine by 4 hours from any acid reflux medication/fibrate/iron/calcium/multivitamin. Advised to take birth control pills and nutritional supplements in the evening. Repeat lab before next visit or sooner if symptoms of hyperthyroidism or hypothyroidism develop.  Notify us immediately in case of pregnancy/breastfeeding or significant weight gain or loss. Counseled on compliance and follow up needs.  Mild thyromegaly noted on physical exam No baseline thyroid ultrasound, ordered it   I have reviewed current medications, nurse's notes, allergies, vital signs, past medical and surgical history, family medical history, and social history for this encounter. Counseled patient on symptoms, examination findings, lab findings, imaging results, treatment decisions and monitoring and prognosis. The patient understood the recommendations and agrees with the treatment plan. All questions regarding treatment plan were fully answered.   Return in about 3 months (around 07/11/2023) for  tele-visit: 3:20 pm, labs before next visit.   Altamese Lake Holm, MD  04/15/23   I have reviewed current medications, nurse's notes, allergies, vital signs, past medical and surgical history, family medical history, and social history for this encounter. Counseled patient on symptoms, examination findings, lab findings, imaging results, treatment decisions and monitoring and prognosis. The patient understood the recommendations and agrees with the treatment plan. All questions regarding treatment plan were fully answered.   History of Present Illness Kelly Walsh is a 42 y.o. year old female who presents to our clinic with subclinical hypothyroidism diagnosed in 2024.    Symptoms suggestive of HYPOTHYROIDISM:  fatigue Yes weight gain Yes cold intolerance  No constipation  Yes, has IBS Menorrhagia Yes  Symptoms suggestive of HYPERTHYROIDISM:  weight loss  No heat intolerance Yes, intermittent hyperdefecation  No  Compressive symptoms:  dysphagia  No dysphonia  No positional dyspnea (especially with simultaneous arms elevation)  No   Physical Exam  BP 130/80   Pulse (!) 108   Ht 5\' 5"  (1.651 m)   Wt 196 lb 3.2 oz (89 kg)   SpO2 97%   BMI 32.65 kg/m  Constitutional: well developed, well nourished Head: normocephalic, atraumatic, no exophthalmos Eyes: sclera anicteric, no redness Neck: + thyromegaly, no thyroid tenderness; no nodules palpated Lungs: normal respiratory effort Neurology: alert and oriented, no fine hand tremor Skin: dry, no appreciable rashes Musculoskeletal: no appreciable defects Psychiatric: normal mood and affect  Allergies Allergies  Allergen Reactions   Bupropion Hives   Metformin Nausea Only   Oxycodone-Acetaminophen Itching   Tramadol Itching   Codeine    Oxycodone    Tape     Adhesive allergy     Current Medications Patient's Medications  New Prescriptions   LEVOTHYROXINE (SYNTHROID) 25 MCG TABLET    Take 1 tablet (25 mcg total) by  mouth daily.  Previous Medications   AMPHETAMINE-DEXTROAMPHETAMINE (ADDERALL) 10 MG TABLET    Take 1 tablet (10 mg total) by mouth 2 (two) times daily with a meal.   ARIPIPRAZOLE (ABILIFY) 10 MG TABLET    Take 1 tablet (10 mg total) by mouth daily.   FLUTICASONE (FLONASE) 50 MCG/ACT NASAL SPRAY    Place 2 sprays into the nose daily.   LORATADINE (CLARITIN) 10 MG TABLET    Take 10 mg by mouth daily.   NAPROXEN SODIUM (ALEVE) 220 MG TABLET    Take 220 mg by mouth as needed.   NYSTATIN-TRIAMCINOLONE (MYCOLOG II) CREAM    Apply 1 Application topically 2 (two) times daily.   PROBIOTIC PRODUCT (PROBIOTIC DAILY PO)    Take by mouth.   SERTRALINE (ZOLOFT) 50 MG TABLET    Take 3 tablets (150 mg total) by mouth daily.   UBROGEPANT (UBRELVY) 100 MG TABS    Take 100 mg by mouth as needed (May repeat dose in 2 hours.  Maximum 2 tablets in 24 hours.).  Modified Medications   No medications on file  Discontinued Medications   SUMATRIPTAN (IMITREX) 100 MG TABLET    Take 1 tablet earliest onset of migraine.  May repeat in 2 hours if headache persists or recurs.  Maximum 2 tablets in 24 hours    Past Medical History Past Medical History:  Diagnosis Date   Anemia    H/O   Hx of migraines    IBS (irritable bowel syndrome)     Past Surgical History Past Surgical History:  Procedure Laterality Date   FOOT SURGERY Right    WISDOM TOOTH EXTRACTION      Family History family history includes Diabetes in her father; Hypertension in her mother; Lupus in her cousin.  Social History Social History   Socioeconomic History   Marital status: Single    Spouse name: Not on file   Number of children: 0   Years of education: Not on file   Highest education level: Some college, no degree  Occupational History   Not on file  Tobacco Use   Smoking status: Never   Smokeless tobacco: Never  Vaping Use   Vaping status: Never Used  Substance and Sexual Activity   Alcohol use: No   Drug use: No   Sexual  activity: Yes    Birth control/protection: Condom  Other Topics Concern   Not on file  Social History Narrative   Right handed, lives in an apartment on 2nd floor alone.   Pt drinks coffee, occasionally hot tea, soda sometimes, water   No children   Does not exercises regularly    Social Determinants of Health   Financial Resource Strain: Not on file  Food Insecurity: Not on file  Transportation Needs: Not on file  Physical Activity: Not on file  Stress: Not on file  Social Connections: Not on file  Intimate Partner Violence: Not on file    Laboratory Investigations Lab Results  Component Value Date   TSH 5.72 (H) 04/03/2023   TSH 3.45 08/03/2022   TSH 2.38 09/01/2019   FREET4 0.74 04/03/2023     Lab Results  Component Value Date   TSI <89 04/03/2023     No components found for: "TRAB"   No results found for: "CHOL" No results found for: "HDL" No results found for: "LDLCALC" No results found  for: "TRIG" No results found for: "CHOLHDL" Lab Results  Component Value Date   CREATININE 1.02 (H) 08/03/2022   Lab Results  Component Value Date   GFR 87.05 09/01/2019      Component Value Date/Time   NA 140 08/03/2022 1557   K 3.8 08/03/2022 1557   CL 104 08/03/2022 1557   CO2 25 08/03/2022 1557   GLUCOSE 65 08/03/2022 1557   BUN 12 08/03/2022 1557   CREATININE 1.02 (H) 08/03/2022 1557   CALCIUM 10.4 (H) 08/03/2022 1557   PROT 7.1 08/03/2022 1557   ALBUMIN 4.0 09/01/2019 1652   AST 15 08/03/2022 1557   ALT 13 08/03/2022 1557   ALKPHOS 51 09/01/2019 1652   BILITOT 0.5 08/03/2022 1557      Latest Ref Rng & Units 08/03/2022    3:57 PM 09/01/2019    4:52 PM 08/25/2018   11:54 AM  BMP  Glucose 65 - 99 mg/dL 65  87  86   BUN 7 - 25 mg/dL 12  9  10    Creatinine 0.50 - 0.99 mg/dL 1.19  1.47  8.29   BUN/Creat Ratio 6 - 22 (calc) 12     Sodium 135 - 146 mmol/L 140  139  139   Potassium 3.5 - 5.3 mmol/L 3.8  4.1  4.1   Chloride 98 - 110 mmol/L 104  105  104   CO2 20  - 32 mmol/L 25  29  28    Calcium 8.6 - 10.2 mg/dL 56.2  13.0  9.8        Component Value Date/Time   WBC 6.9 08/03/2022 1557   RBC 4.32 08/03/2022 1557   HGB 12.2 08/03/2022 1557   HCT 37.3 08/03/2022 1557   PLT 227 08/03/2022 1557   MCV 86.3 08/03/2022 1557   MCH 28.2 08/03/2022 1557   MCHC 32.7 08/03/2022 1557   RDW 13.9 08/03/2022 1557   LYMPHSABS 2,422 08/03/2022 1557   MONOABS 0.4 09/01/2019 1652   EOSABS 69 08/03/2022 1557   BASOSABS 28 08/03/2022 1557      Parts of this note may have been dictated using voice recognition software. There may be variances in spelling and vocabulary which are unintentional. Not all errors are proofread. Please notify the Thereasa Parkin if any discrepancies are noted or if the meaning of any statement is not clear.

## 2023-04-18 ENCOUNTER — Ambulatory Visit (INDEPENDENT_AMBULATORY_CARE_PROVIDER_SITE_OTHER): Payer: Medicaid Other | Admitting: Physician Assistant

## 2023-04-18 DIAGNOSIS — F331 Major depressive disorder, recurrent, moderate: Secondary | ICD-10-CM | POA: Diagnosis not present

## 2023-04-18 DIAGNOSIS — F900 Attention-deficit hyperactivity disorder, predominantly inattentive type: Secondary | ICD-10-CM

## 2023-04-18 DIAGNOSIS — F411 Generalized anxiety disorder: Secondary | ICD-10-CM

## 2023-04-18 MED ORDER — AMPHETAMINE-DEXTROAMPHETAMINE 10 MG PO TABS: 10.0000 mg | ORAL_TABLET | Freq: Two times a day (BID) | ORAL | 0 refills | Status: DC

## 2023-04-18 MED ORDER — SERTRALINE HCL 50 MG PO TABS
150.0000 mg | ORAL_TABLET | Freq: Every day | ORAL | 2 refills | Status: DC
Start: 2023-04-18 — End: 2023-07-24

## 2023-04-18 MED ORDER — ARIPIPRAZOLE 10 MG PO TABS
10.0000 mg | ORAL_TABLET | Freq: Every day | ORAL | 2 refills | Status: DC
Start: 2023-04-18 — End: 2023-07-24

## 2023-04-18 NOTE — Progress Notes (Unsigned)
BH MD/PA/NP OP Progress Note  04/20/2023 11:35 AM Kelly Walsh  MRN:  [id number]  Chief Complaint:  Chief Complaint  Patient presents with   Follow-up   Medication Refill   HPI:   Kelly Walsh is a 42 year old, African-American female with a past psychiatric history significant for attention deficit disorder (unspecified hyperactivity present), anxiety, and major depressive disorder who presents to [facility] for follow-up and medication management.  Patient is currently being managed on the following psychiatric medication:   Zoloft 150 mg daily Abilify 10 mg daily Amphetamine-dextroamphetamine (Adderall) 10 mg daily  Patient presents today encounter stating that she has not been taking her Abilify at the same time every day.  Although patient reports that she has not been taking her Abilify on the same day, she reports that the medication has been helpful mentally.  She reports feeling better mentally; however, she reports that she continues to feel physically fatigued.  She reports that she is currently dragging today.  She states that there are more days than not to where she feels like she has no energy and is only able to leave the house when absolutely necessary.  She reports that her lack of sleep may be attributed to her low energy.  She continues to endorse depression and rates her depression as 5 out of 10 with 10 being most severe.  She reports that her depression has been a lot better than how she felt past.  She endorses depressive episodes half the days of the week with her symptoms not lasting all day long patient endorses the following depressive symptoms: feelings of worthlessness/guilt, shame, and self-isolation.  Patient also endorses anxiety that fluctuates more than her depression.  She reports that her anxiety is attributed to what is going on around her.  Patient endorses general stressors in her life that easily annoyed  her; however, she reports that her level of anger is not as explosive as it was before.  A PHQ-9 screen was performed with the patient scoring a 15.  A GAD-7 screen was also performed with the patient scoring an 11.  Patient is alert and oriented x 4, calm, cooperative, and fully engaged in conversation during the encounter.  Patient endorses okay mood but endorses fatigue.  Patient denies suicidal or homicidal ideations.  She further denies auditory or visual hallucinations and does not appear to be responding to internal/external stimuli.  Patient endorses receiving 8 hours of sleep on average; however, the quality of sleep often varies.  Patient endorses good appetite and eats on average 4 meals per day including snacks.  Patient denies alcohol consumption, tobacco use, or illicit drug use.  Visit Diagnosis:    ICD-10-CM   1. Attention deficit hyperactivity disorder (ADHD), predominantly inattentive type  F90.0 amphetamine-dextroamphetamine (ADDERALL) 10 MG tablet    2. Moderate episode of recurrent major depressive disorder (HCC)  F33.1 sertraline (ZOLOFT) 50 MG tablet    ARIPiprazole (ABILIFY) 10 MG tablet    3. Anxiety state  F41.1 sertraline (ZOLOFT) 50 MG tablet        Past Psychiatric History:  Patient has a past psychiatric history significant for depression, anxiety, and ADD (unspecified hyperactivity present)   Patient denies a past history of hospitalization due to mental health   Patient denies a past history of suicide attempt Patient denies a past history of homicide attempt  Past Medical History:  Past Medical History:  Diagnosis Date   Anemia    H/O  Hx of migraines    IBS (irritable bowel syndrome)     Past Surgical History:  Procedure Laterality Date   FOOT SURGERY Right    WISDOM TOOTH EXTRACTION      Family Psychiatric History:  Patient is unsure of family history of psychiatric illness   Family history of suicide attempt: Patient denies Family  history of homicide: Patient denies Family history of substance abuse: Patient endorses a family history of substance abuse but is unsure of the details  Family History:  Family History  Problem Relation Age of Onset   Diabetes Father    Hypertension Mother    Lupus Cousin     Social History:  Social History   Socioeconomic History   Marital status: Single    Spouse name: Not on file   Number of children: 0   Years of education: Not on file   Highest education level: Some college, no degree  Occupational History   Not on file  Tobacco Use   Smoking status: Never   Smokeless tobacco: Never  Vaping Use   Vaping status: Never Used  Substance and Sexual Activity   Alcohol use: No   Drug use: No   Sexual activity: Yes    Birth control/protection: Condom  Other Topics Concern   Not on file  Social History Narrative   Right handed, lives in an apartment on 2nd floor alone.   Pt drinks coffee, occasionally hot tea, soda sometimes, water   No children   Does not exercises regularly    Social Determinants of Health   Financial Resource Strain: Not on file  Food Insecurity: Not on file  Transportation Needs: Not on file  Physical Activity: Not on file  Stress: Not on file  Social Connections: Not on file    Allergies:  Allergies  Allergen Reactions   Bupropion Hives   Metformin Nausea Only   Oxycodone-Acetaminophen Itching   Tramadol Itching   Codeine    Oxycodone    Tape     Adhesive allergy     Metabolic Disorder Labs: No results found for: "HGBA1C", "MPG" No results found for: "PROLACTIN" No results found for: "CHOL", "TRIG", "HDL", "CHOLHDL", "VLDL", "LDLCALC" Lab Results  Component Value Date   TSH 5.72 (H) 04/03/2023   TSH 3.45 08/03/2022    Therapeutic Level Labs: No results found for: "LITHIUM" No results found for: "VALPROATE" No results found for: "CBMZ"  Current Medications: Current Outpatient Medications  Medication Sig Dispense Refill    amphetamine-dextroamphetamine (ADDERALL) 10 MG tablet Take 1 tablet (10 mg total) by mouth 2 (two) times daily with a meal. 60 tablet 0   ARIPiprazole (ABILIFY) 10 MG tablet Take 1 tablet (10 mg total) by mouth daily. 30 tablet 2   fluticasone (FLONASE) 50 MCG/ACT nasal spray Place 2 sprays into the nose daily.     levothyroxine (SYNTHROID) 25 MCG tablet Take 1 tablet (25 mcg total) by mouth daily. 30 tablet 3   loratadine (CLARITIN) 10 MG tablet Take 10 mg by mouth daily.     naproxen sodium (ALEVE) 220 MG tablet Take 220 mg by mouth as needed.     nystatin-triamcinolone (MYCOLOG II) cream Apply 1 Application topically 2 (two) times daily. 30 g 0   Probiotic Product (PROBIOTIC DAILY PO) Take by mouth.     sertraline (ZOLOFT) 50 MG tablet Take 3 tablets (150 mg total) by mouth daily. 90 tablet 2   Ubrogepant (UBRELVY) 100 MG TABS Take 100 mg by mouth as  needed (May repeat dose in 2 hours.  Maximum 2 tablets in 24 hours.). 10 tablet 5   No current facility-administered medications for this visit.     Musculoskeletal: Strength & Muscle Tone: within normal limits Gait & Station: normal Patient leans: N/A  Psychiatric Specialty Exam: Review of Systems  Psychiatric/Behavioral:  Positive for decreased concentration and sleep disturbance. Negative for dysphoric mood, hallucinations, self-injury and suicidal ideas. The patient is nervous/anxious. The patient is not hyperactive.     Blood pressure 129/71, pulse (!) 112, temperature 98.2 F (36.8 C), temperature source Oral, height 5\' 5"  (1.651 m), weight 196 lb (88.9 kg), SpO2 100%.Body mass index is 32.62 kg/m.  General Appearance: Casual  Eye Contact:  Good  Speech:  Clear and Coherent and Normal Rate  Volume:  Normal  Mood:  Anxious and Depressed  Affect:  Appropriate  Thought Process:  Coherent, Goal Directed, and Descriptions of Associations: Intact  Orientation:  Full (Time, Place, and Person)  Thought Content: WDL   Suicidal  Thoughts:  No  Homicidal Thoughts:  No  Memory:  Immediate;   Good Recent;   Good Remote;   Good  Judgement:  Good  Insight:  Fair  Psychomotor Activity:  Normal  Concentration:  Concentration: Good and Attention Span: Good  Recall:  Good  Fund of Knowledge: Good  Language: Good  Akathisia:  No  Handed:  Right  AIMS (if indicated): not done  Assets:  Communication Skills Desire for Improvement Financial Resources/Insurance Housing Physical Health Social Support Transportation  ADL's:  Intact  Cognition: WNL  Sleep:  Fair   Screenings: GAD-7    Flowsheet Row Clinical Support from 04/18/2023 in [facility] Clinical Support from 03/07/2023 in [facility] Clinical Support from 01/24/2023 in [facility] Clinical Support from 12/13/2022 in [facility] Clinical Support from 10/31/2022 in [facility]  Total GAD-7 Score 11 15 14 10 17       PHQ2-9    Flowsheet Row Clinical Support from 04/18/2023 in [facility] Clinical Support from 03/07/2023 in [facility] Clinical Support from 01/24/2023 in [facility] Clinical Support from 12/13/2022 in [facility] Clinical Support from 10/31/2022 in [facility]  PHQ-2 Total Score 4 3 5 6 6   PHQ-9 Total Score 15 16 22 19 21       Flowsheet Row Clinical Support from 04/18/2023 in [facility] Clinical Support from 03/07/2023 in [facility] Clinical Support from 01/24/2023 in [facility]  C-SSRS RISK CATEGORY No Risk No Risk No Risk        Assessment and Plan:   Kelly Walsh is a 42 year old, African-American female with a past psychiatric history significant for attention deficit  disorder (unspecified hyperactivity present), anxiety, and major depressive disorder who presents to [facility] for follow-up and medication management.  Patient presents to the encounter stating that her mental health has improved overall.  She states that she still continues to experience depressive episodes but states that her symptoms have not been as impactful.  Patient also endorses anxiety related to use stressors in her life that annoy her.  Despite endorsing improvements in her depression, she reports that she is struggling with fatigue.  She reports that her fatigue is so bad some days that she only leaves  the house when absolutely necessary.  Despite her instances of fatigue, patient would like to continue taking her medications as prescribed due to her past experience of starting new medication.  Patient to continue taking medications as prescribed.  Patient's medications to be e-prescribed through pharmacy of choice.  Collaboration of Care: Collaboration of Care: Medication Management AEB provider managing patient's psychiatric medications and Psychiatrist AEB patient being followed by a mental health provider  Patient/Guardian was advised Release of Information must be obtained prior to any record release in order to collaborate their care with an outside provider. Patient/Guardian was advised if they have not already done so to contact the registration department to sign all necessary forms in order for [place] to release information regarding their care.   Consent: Patient/Guardian gives verbal consent for treatment and assignment of benefits for services provided during this visit. Patient/Guardian expressed understanding and agreed to proceed.   1. Attention deficit hyperactivity disorder (ADHD), predominantly inattentive type  - amphetamine-dextroamphetamine (ADDERALL) 10 MG tablet; Take 1 tablet (10 mg total) by mouth 2 (two) times daily with a meal.   Dispense: 60 tablet; Refill: 0  2. Moderate episode of recurrent major depressive disorder (HCC)  - sertraline (ZOLOFT) 50 MG tablet; Take 3 tablets (150 mg total) by mouth daily.  Dispense: 90 tablet; Refill: 2 - ARIPiprazole (ABILIFY) 10 MG tablet; Take 1 tablet (10 mg total) by mouth daily.  Dispense: 30 tablet; Refill: 2  3. Anxiety state  - sertraline (ZOLOFT) 50 MG tablet; Take 3 tablets (150 mg total) by mouth daily.  Dispense: 90 tablet; Refill: 2  Patient to follow up in 6 weeks Provider spent a total of 37 minutes with the patient/reviewing the patient's chart  Meta Hatchet, PA 04/20/2023, 11:35 AM

## 2023-04-20 ENCOUNTER — Encounter (HOSPITAL_COMMUNITY): Payer: Self-pay | Admitting: Physician Assistant

## 2023-06-20 ENCOUNTER — Encounter (HOSPITAL_COMMUNITY): Payer: Medicaid Other | Admitting: Physician Assistant

## 2023-06-20 ENCOUNTER — Encounter (HOSPITAL_COMMUNITY): Payer: Self-pay

## 2023-07-09 ENCOUNTER — Telehealth: Payer: Self-pay

## 2023-07-09 DIAGNOSIS — E038 Other specified hypothyroidism: Secondary | ICD-10-CM

## 2023-07-09 NOTE — Telephone Encounter (Signed)
 Orders Placed This Encounter  Procedures   TSH   T4, free

## 2023-07-10 ENCOUNTER — Other Ambulatory Visit: Payer: Medicaid Other

## 2023-07-16 ENCOUNTER — Telehealth: Payer: Medicaid Other | Admitting: "Endocrinology

## 2023-07-23 ENCOUNTER — Encounter (HOSPITAL_COMMUNITY): Payer: Self-pay

## 2023-07-24 ENCOUNTER — Encounter (HOSPITAL_COMMUNITY): Payer: Self-pay | Admitting: Physician Assistant

## 2023-07-24 ENCOUNTER — Telehealth (INDEPENDENT_AMBULATORY_CARE_PROVIDER_SITE_OTHER): Payer: Medicaid Other | Admitting: Physician Assistant

## 2023-07-24 DIAGNOSIS — F331 Major depressive disorder, recurrent, moderate: Secondary | ICD-10-CM

## 2023-07-24 DIAGNOSIS — F900 Attention-deficit hyperactivity disorder, predominantly inattentive type: Secondary | ICD-10-CM | POA: Diagnosis not present

## 2023-07-24 DIAGNOSIS — F411 Generalized anxiety disorder: Secondary | ICD-10-CM | POA: Diagnosis not present

## 2023-07-24 DIAGNOSIS — Z79899 Other long term (current) drug therapy: Secondary | ICD-10-CM

## 2023-07-24 MED ORDER — ARIPIPRAZOLE 10 MG PO TABS: 10.0000 mg | ORAL_TABLET | Freq: Every day | ORAL | 1 refills | Status: DC

## 2023-07-24 MED ORDER — SERTRALINE HCL 50 MG PO TABS: 150.0000 mg | ORAL_TABLET | Freq: Every day | ORAL | 1 refills | Status: DC

## 2023-07-24 MED ORDER — AMPHETAMINE-DEXTROAMPHETAMINE 15 MG PO TABS: 15.0000 mg | ORAL_TABLET | Freq: Two times a day (BID) | ORAL | 0 refills | Status: DC

## 2023-07-24 NOTE — Progress Notes (Signed)
BH MD/PA/NP OP Progress Note  Virtual Visit via Video Note  I connected with Kelly Walsh on 07/24/23 at  4:00 PM EST by a video enabled telemedicine application and verified that I am speaking with the correct person using two identifiers.  Location: Patient: Home Provider: Clinic   I discussed the limitations of evaluation and management by telemedicine and the availability of in person appointments. The patient expressed understanding and agreed to proceed.  Follow Up Instructions:  I discussed the assessment and treatment plan with the patient. The patient was provided an opportunity to ask questions and all were answered. The patient agreed with the plan and demonstrated an understanding of the instructions.   The patient was advised to call back or seek an in-person evaluation if the symptoms worsen or if the condition fails to improve as anticipated.  I provided 28 minutes of non-face-to-face time during this encounter.  Meta Hatchet, PA    07/24/2023 7:47 PM Kelly Walsh  MRN:  [id number]  Chief Complaint:  Chief Complaint  Patient presents with   Follow-up   Medication Management   HPI:   Kelly Walsh is a 43 year old, African-American female with a past psychiatric history significant for attention deficit disorder (unspecified hyperactivity present), anxiety, and major depressive disorder who presents to [facility] via virtual video visit for follow-up and medication management.  Patient is currently being managed on the following psychiatric medications:   Zoloft 150 mg daily Abilify 10 mg daily Amphetamine-dextroamphetamine (Adderall) 10 mg daily  Patient reports that her use of Adderall has not been effective with managing her inattentiveness.  She reports that she is able to focus some but does not notice a significant difference.  She states that she continues to tolerate the medication but feels that her  expectation for the medications effectiveness was set to high.  Patient reports the lack of ability to get anything done and states that it takes an abundance of energy to get things started.  She reports that the medication does help her to stay awake.    Patient reports that the medication does not take long to go into effect when taking it (20 to 30 minutes).  Patient believes that the medication lasts for 6 hours before leaving its effectiveness.  She reports that she occasionally does not feel the effects of the medication.  Patient is currently taking Adderall 10 mg 2 times daily.  In regards to potential side effects from her use of Adderall, patient reports that she may experience heart palpitations of this she had a cup of coffee.  Patient reports that her mood is okay but continues to endorse depression.  Patient rates her depression as 6 out of 10 with 10 being most severe.  Patient denies sadness but reports that she does feel "blah" and not excited about anything.  Patient endorses depressive symptoms every day but states that she normally feels this way during the winter months.  Patient endorses the following depressive symptoms: lack of motivation, decreased energy, excessive worrying hopelessness, and feelings of guilt/worthlessness.  Patient denies irritability.  In addition to depression, patient endorses anxiety.  She reports that since starting her medications, her anxiety is better than it used to be.  Patient rates her anxiety as 6 out of 10.  Patient continues to endorse the same stressors but does not go into detail.  A PHQ-9 screen was performed with the patient scoring a 16.  A GAD-7 screen  was also performed the patient scoring a 6.  Patient is alert and oriented x 4, calm, cooperative, and fully engaged in conversation during the encounter.  Patient describes her mood as fine.  Patient denies suicidal or homicidal ideations.  She further denied auditory or visual hallucinations  and does not appear to be responding to internal/external stimuli.  Patient endorses fair sleep and receives on average more than 8 hours of sleep per night.  Patient endorses increased appetite stating that she eats on average 3 meals per day along with snacking in between.  Patient denies alcohol consumption, tobacco use, or illicit drug use.  Visit Diagnosis:    ICD-10-CM   1. Long term current use of antipsychotic medication  Z79.899 CBC with Differential    Comprehensive Metabolic Panel (CMET)    HgB A1c    Lipid Profile    Lipid Profile    HgB A1c    Comprehensive Metabolic Panel (CMET)    CBC with Differential    2. Moderate episode of recurrent major depressive disorder (HCC)  F33.1 ARIPiprazole (ABILIFY) 10 MG tablet    sertraline (ZOLOFT) 50 MG tablet    3. Attention deficit hyperactivity disorder (ADHD), predominantly inattentive type  F90.0 amphetamine-dextroamphetamine (ADDERALL) 15 MG tablet    Urine Drug Panel 7    Urine Drug Panel 7    4. Anxiety state  F41.1 sertraline (ZOLOFT) 50 MG tablet       Past Psychiatric History:  Patient has a past psychiatric history significant for depression, anxiety, and ADD (unspecified hyperactivity present)   Patient denies a past history of hospitalization due to mental health   Patient denies a past history of suicide attempt Patient denies a past history of homicide attempt  Past Medical History:  Past Medical History:  Diagnosis Date   Anemia    H/O   Hx of migraines    IBS (irritable bowel syndrome)     Past Surgical History:  Procedure Laterality Date   FOOT SURGERY Right    WISDOM TOOTH EXTRACTION      Family Psychiatric History:  Patient is unsure of family history of psychiatric illness   Family history of suicide attempt: Patient denies Family history of homicide: Patient denies Family history of substance abuse: Patient endorses a family history of substance abuse but is unsure of the details  Family  History:  Family History  Problem Relation Age of Onset   Diabetes Father    Hypertension Mother    Lupus Cousin     Social History:  Social History   Socioeconomic History   Marital status: Single    Spouse name: Not on file   Number of children: 0   Years of education: Not on file   Highest education level: Some college, no degree  Occupational History   Not on file  Tobacco Use   Smoking status: Never   Smokeless tobacco: Never  Vaping Use   Vaping status: Never Used  Substance and Sexual Activity   Alcohol use: No   Drug use: No   Sexual activity: Yes    Birth control/protection: Condom  Other Topics Concern   Not on file  Social History Narrative   Right handed, lives in an apartment on 2nd floor alone.   Pt drinks coffee, occasionally hot tea, soda sometimes, water   No children   Does not exercises regularly    Social Drivers of Corporate investment banker Strain: Not on file  Food Insecurity: Not on  file  Transportation Needs: Not on file  Physical Activity: Not on file  Stress: Not on file  Social Connections: Not on file    Allergies:  Allergies  Allergen Reactions   Bupropion Hives   Metformin Nausea Only   Oxycodone-Acetaminophen Itching   Tramadol Itching   Codeine    Oxycodone    Tape     Adhesive allergy     Metabolic Disorder Labs: No results found for: "HGBA1C", "MPG" No results found for: "PROLACTIN" No results found for: "CHOL", "TRIG", "HDL", "CHOLHDL", "VLDL", "LDLCALC" Lab Results  Component Value Date   TSH 5.72 (H) 04/03/2023   TSH 3.45 08/03/2022    Therapeutic Level Labs: No results found for: "LITHIUM" No results found for: "VALPROATE" No results found for: "CBMZ"  Current Medications: Current Outpatient Medications  Medication Sig Dispense Refill   amphetamine-dextroamphetamine (ADDERALL) 15 MG tablet Take 1 tablet by mouth 2 (two) times daily with a meal. 60 tablet 0   ARIPiprazole (ABILIFY) 10 MG tablet Take  1 tablet (10 mg total) by mouth daily. 30 tablet 1   fluticasone (FLONASE) 50 MCG/ACT nasal spray Place 2 sprays into the nose daily.     levothyroxine (SYNTHROID) 25 MCG tablet Take 1 tablet (25 mcg total) by mouth daily. 30 tablet 3   loratadine (CLARITIN) 10 MG tablet Take 10 mg by mouth daily.     naproxen sodium (ALEVE) 220 MG tablet Take 220 mg by mouth as needed.     nystatin-triamcinolone (MYCOLOG II) cream Apply 1 Application topically 2 (two) times daily. 30 g 0   Probiotic Product (PROBIOTIC DAILY PO) Take by mouth.     sertraline (ZOLOFT) 50 MG tablet Take 3 tablets (150 mg total) by mouth daily. 90 tablet 1   Ubrogepant (UBRELVY) 100 MG TABS Take 100 mg by mouth as needed (May repeat dose in 2 hours.  Maximum 2 tablets in 24 hours.). 10 tablet 5   No current facility-administered medications for this visit.     Musculoskeletal: Strength & Muscle Tone: within normal limits Gait & Station: normal Patient leans: N/A  Psychiatric Specialty Exam: Review of Systems  Psychiatric/Behavioral:  Positive for decreased concentration and sleep disturbance. Negative for dysphoric mood, hallucinations, self-injury and suicidal ideas. The patient is nervous/anxious. The patient is not hyperactive.     There were no vitals taken for this visit.There is no height or weight on file to calculate BMI.  General Appearance: Casual  Eye Contact:  Good  Speech:  Clear and Coherent and Normal Rate  Volume:  Normal  Mood:  Anxious and Depressed  Affect:  Appropriate  Thought Process:  Coherent, Goal Directed, and Descriptions of Associations: Intact  Orientation:  Full (Time, Place, and Person)  Thought Content: WDL   Suicidal Thoughts:  No  Homicidal Thoughts:  No  Memory:  Immediate;   Good Recent;   Good Remote;   Good  Judgement:  Good  Insight:  Fair  Psychomotor Activity:  Normal  Concentration:  Concentration: Good and Attention Span: Good  Recall:  Good  Fund of Knowledge: Good   Language: Good  Akathisia:  No  Handed:  Right  AIMS (if indicated): not done  Assets:  Communication Skills Desire for Improvement Financial Resources/Insurance Housing Physical Health Social Support Transportation  ADL's:  Intact  Cognition: WNL  Sleep:  Fair   Screenings: GAD-7    Flowsheet Row Video Visit from 07/24/2023 in [facility] Clinical Support from 04/18/2023 in [place]  [facility] Clinical Support from 03/07/2023 in [facility] Clinical Support from 01/24/2023 in [facility] Clinical Support from 12/13/2022 in [facility]  Total GAD-7 Score 6 11 15 14 10       PHQ2-9    Flowsheet Row Video Visit from 07/24/2023 in [facility] Clinical Support from 04/18/2023 in [facility] Clinical Support from 03/07/2023 in [facility] Clinical Support from 01/24/2023 in [facility] Clinical Support from 12/13/2022 in [facility]  PHQ-2 Total Score 5 4 3 5 6   PHQ-9 Total Score 16 15 16 22 19       Flowsheet Row Video Visit from 07/24/2023 in [facility] Clinical Support from 04/18/2023 in [facility] Clinical Support from 03/07/2023 in [facility]  C-SSRS RISK CATEGORY No Risk No Risk No Risk        Assessment and Plan:   Kelly Walsh is a 43 year old, African-American female with a past psychiatric history significant for attention deficit disorder (unspecified hyperactivity present), anxiety, and major depressive disorder who presents to [facility] via virtual video visit for follow-up and medication management.   Patient presents to encounter stating that she has been taking her  Adderall regularly.  Despite taking her medication regularly, she reports that she has not noticed any significant changes in her ability to focus.  Patient also reports that she lacks the ability to get anything done.  She does report that she feels more alert and awake when taking the medication.  Patient endorses minimal side effects from her use of Adderall stating that she occasionally experiences heart palpitation.  She reports that this symptom is minor and likens it to feeling like she has had a cup of coffee.  Due to her lack of focus and inattentiveness even when using Adderall, provider recommended increasing her dosage from 10 mg 2 times daily to 15 mg 2 times daily.  Patient was agreeable to recommendation.  Due to her use of Adderall, provider informed patient that a urine drug screen would need to be performed to make sure she is taking her medication appropriately.  Patient vocalized understanding.  Patient reports that she continues to experience depression but states that her symptoms have been less impactful since being on her current medication regimen.  Patient believes that her current symptoms of depression are attributed to the winter months stating that she normally gets sad during the winter months.  Patient also states that she experiences anxiety; however, her symptoms have been less impactful since being on her current medication regimen.  Despite her symptoms of depression and anxiety, patient would like to continue taking her sertraline and Abilify as prescribed.  Patient's medications to be e-prescribed to pharmacy of choice.  Due to her use of Abilify, provider informed patient that the following labs will need to be performed: Lipid profile, complete metabolic panel, complete blood count with differential, and hemoglobin A1c.  Patient vocalized understanding.  Collaboration of Care: Collaboration of Care: Medication Management AEB provider managing patient's psychiatric  medications and Psychiatrist AEB patient being followed by a mental health provider  Patient/Guardian was advised Release of Information must be obtained prior to any record release in order to collaborate their care with an outside provider. Patient/Guardian was advised if they have not already done so to contact  the registration department to sign all necessary forms in order for [place] to release information regarding their care.   Consent: Patient/Guardian gives verbal consent for treatment and assignment of benefits for services provided during this visit. Patient/Guardian expressed understanding and agreed to proceed.   1. Moderate episode of recurrent major depressive disorder (HCC)  - ARIPiprazole (ABILIFY) 10 MG tablet; Take 1 tablet (10 mg total) by mouth daily.  Dispense: 30 tablet; Refill: 1 - sertraline (ZOLOFT) 50 MG tablet; Take 3 tablets (150 mg total) by mouth daily.  Dispense: 90 tablet; Refill: 1  2. Attention deficit hyperactivity disorder (ADHD), predominantly inattentive type  - amphetamine-dextroamphetamine (ADDERALL) 15 MG tablet; Take 1 tablet by mouth 2 (two) times daily with a meal.  Dispense: 60 tablet; Refill: 0 - Urine Drug Panel 7; Future - Urine Drug Panel 7  3. Anxiety state  - sertraline (ZOLOFT) 50 MG tablet; Take 3 tablets (150 mg total) by mouth daily.  Dispense: 90 tablet; Refill: 1  4. Long term current use of antipsychotic medication (Primary)  - CBC with Differential; Future - Comprehensive Metabolic Panel (CMET); Future - HgB A1c; Future - Lipid Profile; Future - Lipid Profile - HgB A1c - Comprehensive Metabolic Panel (CMET) - CBC with Differential  Patient to follow up in 6 weeks Provider spent a total of 28 minutes with the patient/reviewing the patient's chart  Meta Hatchet, PA 07/24/2023, 7:47 PM

## 2023-08-01 ENCOUNTER — Telehealth (HOSPITAL_COMMUNITY): Payer: Self-pay

## 2023-08-01 NOTE — Telephone Encounter (Signed)
PA has been submitted waiting on approval. 08/01/23

## 2023-08-01 NOTE — Telephone Encounter (Signed)
Pa was approved today for Abilify.

## 2023-08-01 NOTE — Telephone Encounter (Signed)
Message acknowledged and reviewed.

## 2023-09-11 ENCOUNTER — Ambulatory Visit (INDEPENDENT_AMBULATORY_CARE_PROVIDER_SITE_OTHER): Payer: Medicaid Other | Admitting: Physician Assistant

## 2023-09-11 DIAGNOSIS — F331 Major depressive disorder, recurrent, moderate: Secondary | ICD-10-CM | POA: Diagnosis not present

## 2023-09-11 DIAGNOSIS — F900 Attention-deficit hyperactivity disorder, predominantly inattentive type: Secondary | ICD-10-CM

## 2023-09-11 DIAGNOSIS — F411 Generalized anxiety disorder: Secondary | ICD-10-CM | POA: Diagnosis not present

## 2023-09-11 MED ORDER — SERTRALINE HCL 50 MG PO TABS: ORAL_TABLET | ORAL | 0 refills | Status: DC

## 2023-09-11 MED ORDER — ARIPIPRAZOLE 10 MG PO TABS: 10.0000 mg | ORAL_TABLET | Freq: Every day | ORAL | 1 refills | Status: DC

## 2023-09-11 MED ORDER — FLUOXETINE HCL 10 MG PO CAPS: ORAL_CAPSULE | ORAL | 0 refills | Status: DC

## 2023-09-11 MED ORDER — AMPHETAMINE-DEXTROAMPHETAMINE 15 MG PO TABS: 15.0000 mg | ORAL_TABLET | Freq: Two times a day (BID) | ORAL | 0 refills | Status: DC

## 2023-09-12 ENCOUNTER — Encounter (HOSPITAL_COMMUNITY): Payer: Self-pay | Admitting: Physician Assistant

## 2023-09-12 LAB — LIPID PANEL
Chol/HDL Ratio: 3.3 ratio (ref 0.0–4.4)
Cholesterol, Total: 173 mg/dL (ref 100–199)
HDL: 52 mg/dL (ref >39)
LDL Chol Calc (NIH): 98 mg/dL (ref 0–99)
Triglycerides: 133 mg/dL (ref 0–149)
VLDL Cholesterol Cal: 23 mg/dL (ref 5–40)

## 2023-09-12 LAB — COMPREHENSIVE METABOLIC PANEL
ALT: 46 IU/L — ABNORMAL HIGH (ref 0–32)
AST: 30 IU/L (ref 0–40)
Albumin: 4.1 g/dL (ref 3.9–4.9)
Alkaline Phosphatase: 89 IU/L (ref 44–121)
BUN/Creatinine Ratio: 7 — ABNORMAL LOW (ref 9–23)
BUN: 6 mg/dL (ref 6–24)
Bilirubin Total: 0.6 mg/dL (ref 0.0–1.2)
CO2: 24 mmol/L (ref 20–29)
Calcium: 10.4 mg/dL — ABNORMAL HIGH (ref 8.7–10.2)
Chloride: 103 mmol/L (ref 96–106)
Creatinine, Ser: 0.89 mg/dL (ref 0.57–1.00)
Globulin, Total: 2.7 g/dL (ref 1.5–4.5)
Glucose: 90 mg/dL (ref 70–99)
Potassium: 4 mmol/L (ref 3.5–5.2)
Sodium: 139 mmol/L (ref 134–144)
Total Protein: 6.8 g/dL (ref 6.0–8.5)
eGFR: 82 mL/min/{1.73_m2} (ref >59)

## 2023-09-12 LAB — CBC WITH DIFFERENTIAL/PLATELET
Basophils Absolute: 0 10*3/uL (ref 0.0–0.2)
Basos: 1 %
EOS (ABSOLUTE): 0.1 10*3/uL (ref 0.0–0.4)
Eos: 2 %
Hematocrit: 41.8 % (ref 34.0–46.6)
Hemoglobin: 13.9 g/dL (ref 11.1–15.9)
Immature Grans (Abs): 0 10*3/uL (ref 0.0–0.1)
Immature Granulocytes: 0 %
Lymphocytes Absolute: 2.2 10*3/uL (ref 0.7–3.1)
Lymphs: 47 %
MCH: 30.3 pg (ref 26.6–33.0)
MCHC: 33.3 g/dL (ref 31.5–35.7)
MCV: 91 fL (ref 79–97)
Monocytes Absolute: 0.3 10*3/uL (ref 0.1–0.9)
Monocytes: 7 %
Neutrophils Absolute: 2 10*3/uL (ref 1.4–7.0)
Neutrophils: 43 %
Platelets: 253 10*3/uL (ref 150–450)
RBC: 4.58 x10E6/uL (ref 3.77–5.28)
RDW: 11.6 % — ABNORMAL LOW (ref 11.7–15.4)
WBC: 4.6 10*3/uL (ref 3.4–10.8)

## 2023-09-12 LAB — HEMOGLOBIN A1C
Est. average glucose Bld gHb Est-mCnc: 100 mg/dL
Hgb A1c MFr Bld: 5.1 % (ref 4.8–5.6)

## 2023-09-12 LAB — URINE DRUG PANEL 7
Amphetamines, Urine: POSITIVE — AB
Barbiturate Quant, Ur: NEGATIVE ng/mL
Benzodiazepine Quant, Ur: NEGATIVE ng/mL
Cannabinoid Quant, Ur: NEGATIVE ng/mL
Cocaine (Metab.): NEGATIVE ng/mL
Opiate Quant, Ur: NEGATIVE ng/mL
PCP Quant, Ur: NEGATIVE ng/mL

## 2023-09-12 NOTE — Progress Notes (Signed)
 [facility] MD/PA/NP OP Progress Note  Virtual Visit via Video Note  I connected with Kelly Walsh on 09/12/23 at  4:00 PM EDT by a video enabled telemedicine application and verified that I am speaking with the correct person using two identifiers.  Location: Patient: Home Provider: Clinic   I discussed the limitations of evaluation and management by telemedicine and the availability of in person appointments. The patient expressed understanding and agreed to proceed.  Follow Up Instructions:  I discussed the assessment and treatment plan with the patient. The patient was provided an opportunity to ask questions and all were answered. The patient agreed with the plan and demonstrated an understanding of the instructions.   The patient was advised to call back or seek an in-person evaluation if the symptoms worsen or if the condition fails to improve as anticipated.  I provided 28 minutes of non-face-to-face time during this encounter.  Meta Hatchet, PA    09/11/2023 9:30 PM Kelly Walsh  MRN:  [id number]  Chief Complaint:  Chief Complaint  Patient presents with   Follow-up   Medication Management   HPI:   Kelly Walsh is a 43 year old, African-American female with a past psychiatric history significant for attention deficit disorder (unspecified hyperactivity present), anxiety, and major depressive disorder who presents to [facility] via virtual video visit for follow-up and medication management.  Patient is currently being managed on the following psychiatric medications:   Zoloft 150 mg daily Abilify 10 mg daily Amphetamine-dextroamphetamine (Adderall) 15 mg daily 2 times daily  Patient reports that she feels the same even when taking her medications.  Patient feels that the medications are doing all that they are able to do.  Patient endorses depression and rates her depression a 7 out of 10 with 10 being most severe.  Patient  endorses depressive episodes every day.  Despite experiencing depression, patient reports that she feels much better than before she started her medications.  At her worst, patient reports that she would sleep most of the day and not leave the house.  Though patient endorses some improvements, she reports that even when she is feeling her best through the use of her medications she still is not able to function.  Patient also states that she has a tendency to feel more down during the winter months.  Patient endorses the following depressive symptoms: lack of motivation, decreased energy, and difficulty completing activities of daily living.  In regards to her Adderall use, patient reports that the effectiveness of the medication is limited when she is depressed.  When taking her Adderall, she reports that the medication kicks in roughly 30 minutes.  She reports the medication last between 6 to 8 hours but she still continues to experience issues with focus and concentration.  Patient denies hyperactivity, restlessness, or impatience.  She further denies experiencing any adverse side effects from her use of Adderall.  Patient endorses anxiety but states that her anxiety is not as severe as her depression.  She reports having apathy for everything with her main issue being that she has no desire to do anything.  A PHQ-9 screen was performed with the patient scoring a 19.  A GAD-7 screen was also performed with the patient scoring a 6.  Patient is alert and oriented x 4, calm, cooperative, and fully engaged in conversation during the encounter.  Patient describes her mood as numb.  Patient exhibits depressed mood with congruent affect.  Patient denies  suicidal or homicidal ideations.  She further denies auditory or visual hallucinations and does not appear to be responding to internal soft external stimuli.  Patient endorses fair sleep and receives on average 8 hours of intermittent sleep.  Patient endorses fair  appetite and eats on average 2 meals per day along with snacking.  Patient denies alcohol consumption, tobacco use, or illicit drug use.  Visit Diagnosis:    ICD-10-CM   1. Moderate episode of recurrent major depressive disorder (HCC)  F33.1 ARIPiprazole (ABILIFY) 10 MG tablet    sertraline (ZOLOFT) 50 MG tablet    FLUoxetine (PROZAC) 10 MG capsule    2. Anxiety state  F41.1 sertraline (ZOLOFT) 50 MG tablet    FLUoxetine (PROZAC) 10 MG capsule    3. Attention deficit hyperactivity disorder (ADHD), predominantly inattentive type  F90.0 amphetamine-dextroamphetamine (ADDERALL) 15 MG tablet     Past Psychiatric History:  Patient has a past psychiatric history significant for depression, anxiety, and ADD (unspecified hyperactivity present)   Patient denies a past history of hospitalization due to mental health   Patient denies a past history of suicide attempt Patient denies a past history of homicide attempt  Past Medical History:  Past Medical History:  Diagnosis Date   Anemia    H/O   Hx of migraines    IBS (irritable bowel syndrome)     Past Surgical History:  Procedure Laterality Date   FOOT SURGERY Right    WISDOM TOOTH EXTRACTION      Family Psychiatric History:  Patient is unsure of family history of psychiatric illness   Family history of suicide attempt: Patient denies Family history of homicide: Patient denies Family history of substance abuse: Patient endorses a family history of substance abuse but is unsure of the details  Family History:  Family History  Problem Relation Age of Onset   Diabetes Father    Hypertension Mother    Lupus Cousin     Social History:  Social History   Socioeconomic History   Marital status: Single    Spouse name: Not on file   Number of children: 0   Years of education: Not on file   Highest education level: Some college, no degree  Occupational History   Not on file  Tobacco Use   Smoking status: Never   Smokeless  tobacco: Never  Vaping Use   Vaping status: Never Used  Substance and Sexual Activity   Alcohol use: No   Drug use: No   Sexual activity: Yes    Birth control/protection: Condom  Other Topics Concern   Not on file  Social History Narrative   Right handed, lives in an apartment on 2nd floor alone.   Pt drinks coffee, occasionally hot tea, soda sometimes, water   No children   Does not exercises regularly    Social Drivers of Corporate investment banker Strain: Not on file  Food Insecurity: Not on file  Transportation Needs: Not on file  Physical Activity: Not on file  Stress: Not on file  Social Connections: Not on file    Allergies:  Allergies  Allergen Reactions   Bupropion Hives   Metformin Nausea Only   Oxycodone-Acetaminophen Itching   Tramadol Itching   Codeine    Oxycodone    Tape     Adhesive allergy     Metabolic Disorder Labs: Lab Results  Component Value Date   HGBA1C 5.1 09/09/2023   No results found for: "PROLACTIN" Lab Results  Component Value  Date   CHOL 173 09/09/2023   TRIG 133 09/09/2023   HDL 52 09/09/2023   CHOLHDL 3.3 09/09/2023   LDLCALC 98 09/09/2023   Lab Results  Component Value Date   TSH 5.72 (H) 04/03/2023   TSH 3.45 08/03/2022    Therapeutic Level Labs: No results found for: "LITHIUM" No results found for: "VALPROATE" No results found for: "CBMZ"  Current Medications: Current Outpatient Medications  Medication Sig Dispense Refill   [START ON 09/25/2023] FLUoxetine (PROZAC) 10 MG capsule Take 1 capsule (10 mg total) by mouth daily for 6 days, THEN 2 capsules (20 mg total) daily for 24 days. 54 capsule 0   amphetamine-dextroamphetamine (ADDERALL) 15 MG tablet Take 1 tablet by mouth 2 (two) times daily with a meal. 60 tablet 0   ARIPiprazole (ABILIFY) 10 MG tablet Take 1 tablet (10 mg total) by mouth daily. 30 tablet 1   fluticasone (FLONASE) 50 MCG/ACT nasal spray Place 2 sprays into the nose daily.     levothyroxine  (SYNTHROID) 25 MCG tablet Take 1 tablet (25 mcg total) by mouth daily. 30 tablet 3   loratadine (CLARITIN) 10 MG tablet Take 10 mg by mouth daily.     naproxen sodium (ALEVE) 220 MG tablet Take 220 mg by mouth as needed.     nystatin-triamcinolone (MYCOLOG II) cream Apply 1 Application topically 2 (two) times daily. 30 g 0   Probiotic Product (PROBIOTIC DAILY PO) Take by mouth.     sertraline (ZOLOFT) 50 MG tablet Take 2 tablets (100 mg total) by mouth daily for 7 days, THEN 1 tablet (50 mg total) daily for 7 days. 21 tablet 0   Ubrogepant (UBRELVY) 100 MG TABS Take 100 mg by mouth as needed (May repeat dose in 2 hours.  Maximum 2 tablets in 24 hours.). 10 tablet 5   No current facility-administered medications for this visit.     Musculoskeletal: Strength & Muscle Tone: within normal limits Gait & Station: normal Patient leans: N/A  Psychiatric Specialty Exam: Review of Systems  Psychiatric/Behavioral:  Positive for dysphoric mood and sleep disturbance. Negative for decreased concentration, hallucinations, self-injury and suicidal ideas. The patient is nervous/anxious. The patient is not hyperactive.     Blood pressure (!) 145/86, pulse (!) 133, temperature 98.5 F (36.9 C), temperature source Oral, height 5\' 5"  (1.651 m), weight 190 lb (86.2 kg), SpO2 98%.Body mass index is 31.62 kg/m.  General Appearance: Casual  Eye Contact:  Good  Speech:  Clear and Coherent and Normal Rate  Volume:  Normal  Mood:  Anxious and Depressed  Affect:  Congruent  Thought Process:  Coherent, Goal Directed, and Descriptions of Associations: Intact  Orientation:  Full (Time, Place, and Person)  Thought Content: WDL   Suicidal Thoughts:  No  Homicidal Thoughts:  No  Memory:  Immediate;   Good Recent;   Good Remote;   Good  Judgement:  Good  Insight:  Fair  Psychomotor Activity:  Normal  Concentration:  Concentration: Good and Attention Span: Good  Recall:  Good  Fund of Knowledge: Good   Language: Good  Akathisia:  No  Handed:  Right  AIMS (if indicated): not done  Assets:  Communication Skills Desire for Improvement Financial Resources/Insurance Housing Physical Health Social Support Transportation  ADL's:  Intact  Cognition: WNL  Sleep:  Fair   Screenings: GAD-7    Flowsheet Row Clinical Support from 09/11/2023 in [facility] Video Visit from 07/24/2023 in [facility] Clinical  Support from 04/18/2023 in [facility] Clinical Support from 03/07/2023 in [facility] Clinical Support from 01/24/2023 in [facility]  Total GAD-7 Score 6 6 11 15 14       PHQ2-9    Flowsheet Row Clinical Support from 09/11/2023 in [facility] Video Visit from 07/24/2023 in [facility] Clinical Support from 04/18/2023 in [facility] Clinical Support from 03/07/2023 in [facility] Clinical Support from 01/24/2023 in [facility]  PHQ-2 Total Score 6 5 4 3 5   PHQ-9 Total Score 19 16 15 16 22       Flowsheet Row Clinical Support from 09/11/2023 in [facility] Video Visit from 07/24/2023 in [facility] Clinical Support from 04/18/2023 in [facility]  C-SSRS RISK CATEGORY No Risk No Risk No Risk        Assessment and Plan:   Kelly Walsh is a 43 year old, African-American female with a past psychiatric history significant for attention deficit disorder (unspecified hyperactivity present), anxiety, and major depressive disorder who presents to [facility] via virtual video visit for follow-up and medication management.  Patient presents today encounter stating that she has noticed some  improvement through the use of her medications, patient reports that she still continues to experience depression mainly characterized by having no desire to do anything.  Patient reports that her Adderall medication is helpful but states that when she is overly depressed, her Adderall medication can only do so much.  Patient also continues to endorse anxiety.  Patient notes that her depression is often much worse during the winter months but she believes that her depression medications are doing all that they can to alleviate her symptoms.  Provider recommended patient discontinue her use of sertraline.  Patient was instructed to take sertraline 100 mg for 7 days, followed by 50 mg for 7 more days before discontinuing.  Patient was recommended Prozac 10 mg for 6 days followed by 20 mg daily for the management of her depressive symptoms and anxiety once she has discontinued her use of sertraline.  Patient vocalized understanding.  Patient to continue taking all other medications as prescribed.  Patient's medications to be e-prescribed to pharmacy of choice.  Patient reports no issues with the use of her Adderall and denies experiencing any adverse side effects.  Collaboration of Care: Collaboration of Care: Medication Management AEB provider managing patient's psychiatric medications and Psychiatrist AEB patient being followed by a mental health provider  Patient/Guardian was advised Release of Information must be obtained prior to any record release in order to collaborate their care with an outside provider. Patient/Guardian was advised if they have not already done so to contact the registration department to sign all necessary forms in order for [place] to release information regarding their care.   Consent: Patient/Guardian gives verbal consent for treatment and assignment of benefits for services provided during this visit. Patient/Guardian expressed understanding and agreed to proceed.   1. Moderate  episode of recurrent major depressive disorder (HCC)  - ARIPiprazole (ABILIFY) 10 MG tablet; Take 1 tablet (10 mg total) by mouth daily.  Dispense: 30 tablet; Refill: 1 - sertraline (ZOLOFT) 50 MG tablet; Take 2 tablets (100 mg total) by mouth daily for 7 days, THEN 1 tablet (50 mg total) daily for 7 days.  Dispense: 21 tablet; Refill: 0 -  FLUoxetine (PROZAC) 10 MG capsule; Take 1 capsule (10 mg total) by mouth daily for 6 days, THEN 2 capsules (20 mg total) daily for 24 days.  Dispense: 54 capsule; Refill: 0  2. Anxiety state  - sertraline (ZOLOFT) 50 MG tablet; Take 2 tablets (100 mg total) by mouth daily for 7 days, THEN 1 tablet (50 mg total) daily for 7 days.  Dispense: 21 tablet; Refill: 0 - FLUoxetine (PROZAC) 10 MG capsule; Take 1 capsule (10 mg total) by mouth daily for 6 days, THEN 2 capsules (20 mg total) daily for 24 days.  Dispense: 54 capsule; Refill: 0  3. Attention deficit hyperactivity disorder (ADHD), predominantly inattentive type  - amphetamine-dextroamphetamine (ADDERALL) 15 MG tablet; Take 1 tablet by mouth 2 (two) times daily with a meal.  Dispense: 60 tablet; Refill: 0  Patient to follow up in 6 weeks Provider spent a total of 29 minutes with the patient/reviewing the patient's chart  Meta Hatchet, PA 09/11/2023, 9:30 PM

## 2023-10-23 ENCOUNTER — Ambulatory Visit (HOSPITAL_COMMUNITY): Admitting: Physician Assistant

## 2023-10-23 ENCOUNTER — Encounter (HOSPITAL_COMMUNITY): Payer: Self-pay | Admitting: Physician Assistant

## 2023-10-23 VITALS — BP 136/87 | HR 113 | Temp 98.5°F | Ht 65.0 in | Wt 186.2 lb

## 2023-10-23 DIAGNOSIS — F900 Attention-deficit hyperactivity disorder, predominantly inattentive type: Secondary | ICD-10-CM | POA: Diagnosis not present

## 2023-10-23 DIAGNOSIS — F411 Generalized anxiety disorder: Secondary | ICD-10-CM

## 2023-10-23 DIAGNOSIS — F331 Major depressive disorder, recurrent, moderate: Secondary | ICD-10-CM

## 2023-10-23 MED ORDER — AMPHETAMINE-DEXTROAMPHETAMINE 15 MG PO TABS: 15.0000 mg | ORAL_TABLET | Freq: Two times a day (BID) | ORAL | 0 refills | Status: DC

## 2023-10-23 MED ORDER — ARIPIPRAZOLE 10 MG PO TABS: 10.0000 mg | ORAL_TABLET | Freq: Every day | ORAL | 1 refills | Status: DC

## 2023-10-23 MED ORDER — FLUOXETINE HCL 20 MG PO CAPS: 20.0000 mg | ORAL_CAPSULE | Freq: Every day | ORAL | 1 refills | Status: DC

## 2023-10-23 NOTE — Progress Notes (Signed)
 BH MD/PA/NP OP Progress Note  Virtual Visit via Video Note  I connected with Kelly Walsh on 10/23/23 at  4:00 PM EDT by a video enabled telemedicine application and verified that I am speaking with the correct person using two identifiers.  Location: Patient: Home Provider: Clinic   I discussed the limitations of evaluation and management by telemedicine and the availability of in person appointments. The patient expressed understanding and agreed to proceed.  Follow Up Instructions:  I discussed the assessment and treatment plan with the patient. The patient was provided an opportunity to ask questions and all were answered. The patient agreed with the plan and demonstrated an understanding of the instructions.   The patient was advised to call back or seek an in-person evaluation if the symptoms worsen or if the condition fails to improve as anticipated.  I provided 32 minutes of non-face-to-face time during this encounter.  Gates Kasal, PA    10/23/2023 8:13 PM Kelly Walsh  MRN:  [id number]  Chief Complaint:  Chief Complaint  Patient presents with   Follow-up   Medication Refill   HPI:   Kelly Walsh is a 43 year old, African-American female with a past psychiatric history significant for attention deficit disorder (unspecified hyperactivity present), anxiety, and major depressive disorder who presents to [facility] via virtual video visit for follow-up and medication management.  Patient is currently being managed on the following psychiatric medications:   Prozac  20 mg daily Abilify  10 mg daily Amphetamine -dextroamphetamine  (Adderall) 15 mg daily 2 times daily  Since taking Prozac , patient reports that she has experienced some side effects but nothing compared to the side effects she experienced while on sertraline .  She reports experiencing some headaches (history of migraines), sleep disturbances on occasion, and  cold chills.  She denies experiencing any nausea from her use of Prozac .  Patient denies experiencing any rigidity, stiffness, or involuntary movements from the use of her Abilify .  Since taking Prozac , patient reports that there have been no changes in the level of her depression.  She reports that she has been taking Prozac  for roughly 3 weeks now.  Patient rates her depression as 6 out of 10 with 10 being most severe.  Patient endorses depressive episodes every day.  Patient endorses the following depressive symptoms: feelings of sadness, irritability, decreased concentration, decreased energy (improved), feelings of guilt/worthlessness, and hopelessness.  Patient denies crying spells.  Patient continues to endorse anxiety but states that she is unsure if her anxiety is elevated from switching from 1 antidepressant to another.  A PHQ-9 screen was performed with the patient scoring an 18.  A GAD-7 screen was also performed with the patient scoring a 17.  Patient reports that she continues to take her Adderall regularly.  She reports that when she takes the medication, it can take anywhere between 5 to 10 minutes / 30 minutes to an hour before the medication kicks in.  She reports that majority of the time, the medication takes effect fairly quickly.  She reports that she has not noticed any beneficial changes from the adjustment of her Adderall from 10 mg to 15 mg, but she does report that she is able to be productive without crashing.  Patient denies experiencing any adverse side effects from her use of Adderall such as palpitations or irritability.  Patient is alert and oriented x 4, calm, cooperative, and fully engaged in conversation during the encounter.  Patient endorses neutral mood.  Patient  exhibits depressed mood with appropriate affect.  Patient denies suicidal or homicidal ideations.  She further denies auditory or visual hallucinations and does not appear to be responding to internal/external  stimuli.  Patient endorses good sleep and receives on average 6 to 8 hours of sleep per night.  She reports that she gets enough sleep to where she does not feel like she needs to take a nap during the day.  Patient endorses fair appetite and eats on average 2 meals per day.  Patient denies alcohol consumption, tobacco use, or illicit drug use.  Visit Diagnosis:    ICD-10-CM   1. Moderate episode of recurrent major depressive disorder (HCC)  F33.1 ARIPiprazole  (ABILIFY ) 10 MG tablet    FLUoxetine  (PROZAC ) 20 MG capsule    2. Anxiety state  F41.1 FLUoxetine  (PROZAC ) 20 MG capsule    3. Attention deficit hyperactivity disorder (ADHD), predominantly inattentive type  F90.0 amphetamine -dextroamphetamine  (ADDERALL) 15 MG tablet      Past Psychiatric History:  Patient has a past psychiatric history significant for depression, anxiety, and ADD (unspecified hyperactivity present)   Patient denies a past history of hospitalization due to mental health   Patient denies a past history of suicide attempt Patient denies a past history of homicide attempt  Past Medical History:  Past Medical History:  Diagnosis Date   Anemia    H/O   Hx of migraines    IBS (irritable bowel syndrome)     Past Surgical History:  Procedure Laterality Date   FOOT SURGERY Right    WISDOM TOOTH EXTRACTION      Family Psychiatric History:  Patient is unsure of family history of psychiatric illness   Family history of suicide attempt: Patient denies Family history of homicide: Patient denies Family history of substance abuse: Patient endorses a family history of substance abuse but is unsure of the details  Family History:  Family History  Problem Relation Age of Onset   Diabetes Father    Hypertension Mother    Lupus Cousin     Social History:  Social History   Socioeconomic History   Marital status: Single    Spouse name: Not on file   Number of children: 0   Years of education: Not on file    Highest education level: Some college, no degree  Occupational History   Not on file  Tobacco Use   Smoking status: Never   Smokeless tobacco: Never  Vaping Use   Vaping status: Never Used  Substance and Sexual Activity   Alcohol use: No   Drug use: No   Sexual activity: Yes    Birth control/protection: Condom  Other Topics Concern   Not on file  Social History Narrative   Right handed, lives in an apartment on 2nd floor alone.   Pt drinks coffee, occasionally hot tea, soda sometimes, water   No children   Does not exercises regularly    Social Drivers of Corporate investment banker Strain: Not on file  Food Insecurity: Not on file  Transportation Needs: Not on file  Physical Activity: Not on file  Stress: Not on file  Social Connections: Not on file    Allergies:  Allergies  Allergen Reactions   Bupropion Hives   Metformin Nausea Only   Oxycodone-Acetaminophen  Itching   Tramadol  Itching   Codeine    Oxycodone    Tape     Adhesive allergy     Metabolic Disorder Labs: Lab Results  Component Value Date  HGBA1C 5.1 09/09/2023   No results found for: "PROLACTIN" Lab Results  Component Value Date   CHOL 173 09/09/2023   TRIG 133 09/09/2023   HDL 52 09/09/2023   CHOLHDL 3.3 09/09/2023   LDLCALC 98 09/09/2023   Lab Results  Component Value Date   TSH 5.72 (H) 04/03/2023   TSH 3.45 08/03/2022    Therapeutic Level Labs: No results found for: "LITHIUM" No results found for: "VALPROATE" No results found for: "CBMZ"  Current Medications: Current Outpatient Medications  Medication Sig Dispense Refill   amphetamine -dextroamphetamine  (ADDERALL) 15 MG tablet Take 1 tablet by mouth 2 (two) times daily with a meal. 60 tablet 0   ARIPiprazole  (ABILIFY ) 10 MG tablet Take 1 tablet (10 mg total) by mouth daily. 30 tablet 1   FLUoxetine  (PROZAC ) 20 MG capsule Take 1 capsule (20 mg total) by mouth daily. 30 capsule 1   fluticasone (FLONASE) 50 MCG/ACT nasal spray  Place 2 sprays into the nose daily.     levothyroxine  (SYNTHROID ) 25 MCG tablet Take 1 tablet (25 mcg total) by mouth daily. 30 tablet 3   loratadine (CLARITIN) 10 MG tablet Take 10 mg by mouth daily.     naproxen sodium (ALEVE) 220 MG tablet Take 220 mg by mouth as needed.     nystatin -triamcinolone  (MYCOLOG II) cream Apply 1 Application topically 2 (two) times daily. 30 g 0   Probiotic Product (PROBIOTIC DAILY PO) Take by mouth.     Ubrogepant  (UBRELVY ) 100 MG TABS Take 100 mg by mouth as needed (May repeat dose in 2 hours.  Maximum 2 tablets in 24 hours.). 10 tablet 5   No current facility-administered medications for this visit.     Musculoskeletal: Strength & Muscle Tone: within normal limits Gait & Station: normal Patient leans: N/A  Psychiatric Specialty Exam: Review of Systems  Psychiatric/Behavioral:  Positive for dysphoric mood and sleep disturbance. Negative for decreased concentration, hallucinations, self-injury and suicidal ideas. The patient is nervous/anxious. The patient is not hyperactive.     Blood pressure 136/87, pulse (!) 113, temperature 98.5 F (36.9 C), temperature source Oral, height 5\' 5"  (1.651 m), weight 186 lb 3.2 oz (84.5 kg), SpO2 97%.Body mass index is 30.99 kg/m.  General Appearance: Casual  Eye Contact:  Good  Speech:  Clear and Coherent and Normal Rate  Volume:  Normal  Mood:  Anxious and Depressed  Affect:  Congruent  Thought Process:  Coherent, Goal Directed, and Descriptions of Associations: Intact  Orientation:  Full (Time, Place, and Person)  Thought Content: WDL   Suicidal Thoughts:  No  Homicidal Thoughts:  No  Memory:  Immediate;   Good Recent;   Good Remote;   Good  Judgement:  Good  Insight:  Fair  Psychomotor Activity:  Normal  Concentration:  Concentration: Good and Attention Span: Good  Recall:  Good  Fund of Knowledge: Good  Language: Good  Akathisia:  No  Handed:  Right  AIMS (if indicated): not done  Assets:   Communication Skills Desire for Improvement Financial Resources/Insurance Housing Physical Health Social Support Transportation  ADL's:  Intact  Cognition: WNL  Sleep:  Fair   Screenings: GAD-7    Flowsheet Row Clinical Support from 10/23/2023 in [facility] Clinical Support from 09/11/2023 in [facility] Video Visit from 07/24/2023 in [facility] Clinical Support from 04/18/2023 in [facility] Clinical Support from 03/07/2023 in [facility]  Total GAD-7  Score 17 6 6 11 15       PHQ2-9    Flowsheet Row Clinical Support from 10/23/2023 in [facility] Clinical Support from 09/11/2023 in [facility] Video Visit from 07/24/2023 in [facility] Clinical Support from 04/18/2023 in [facility] Clinical Support from 03/07/2023 in [facility]  PHQ-2 Total Score 5 6 5 4 3   PHQ-9 Total Score 18 19 16 15 16       Flowsheet Row Clinical Support from 10/23/2023 in [facility] Clinical Support from 09/11/2023 in [facility] Video Visit from 07/24/2023 in [facility]  C-SSRS RISK CATEGORY No Risk No Risk No Risk        Assessment and Plan:   Kelly Walsh is a 43 year old, African-American female with a past psychiatric history significant for attention deficit disorder (unspecified hyperactivity present), anxiety, and major depressive disorder who presents to [facility] via virtual video visit for follow-up and medication management.  Patient presents to the encounter stating that she has experienced some side effects from her use of Prozac  such as headaches, history of migraines), sleep  disturbances on occasion, and cold chills.  She denies experiencing nausea which was a side effect that she often experienced when she was taking sertraline .  Patient denies experiencing rigidity, stiffness, or involuntary movement (TD) from the use of her Abilify .  Due to time constraints, provider was unable to do a formal aims assessment; however, no abnormal movements/involuntary movements were observed during the assessment.  Patient reports that she continues to take her Adderall regularly.  She reports that she has not noticed any significant benefits from the medications since adjusting from 10 mg to 15 mg.  She does report that she able to be more productive during the day when taking Adderall without crashing.  Patient denies experiencing any significant side effects from her use of Adderall.  PDMP was reviewed following the conclusion of the encounter.  Since taking Prozac , patient reports that she still continues to experience depression; however, her energy levels have improved.  She endorses elevated anxiety but is unsure if her increase in anxiety is due to switching from antidepressant to another.  Despite patient continuing to experience depression and anxiety, patient would like to continue taking Prozac  as prescribed due to only taking it for roughly 3 weeks at this time.  Patient to continue taking her other medications as prescribed.  Patient's medications to be e-prescribed to pharmacy of choice.  Patient's most recent labs were obtained and resulted on 09/09/2023.  Collaboration of Care: Collaboration of Care: Medication Management AEB provider managing patient's psychiatric medications and Psychiatrist AEB patient being followed by a mental health provider  Patient/Guardian was advised Release of Information must be obtained prior to any record release in order to collaborate their care with an outside provider. Patient/Guardian was advised if they have not already done so to contact  the registration department to sign all necessary forms in order for us  to release information regarding their care.   Consent: Patient/Guardian gives verbal consent for treatment and assignment of benefits for services provided during this visit. Patient/Guardian expressed understanding and agreed to proceed.   1. Moderate episode of recurrent major depressive disorder (HCC) (Primary)  - ARIPiprazole  (ABILIFY ) 10 MG tablet; Take 1 tablet (10 mg total) by mouth daily.  Dispense: 30 tablet; Refill: 1 - FLUoxetine  (  PROZAC ) 20 MG capsule; Take 1 capsule (20 mg total) by mouth daily.  Dispense: 30 capsule; Refill: 1  2. Anxiety state  - FLUoxetine  (PROZAC ) 20 MG capsule; Take 1 capsule (20 mg total) by mouth daily.  Dispense: 30 capsule; Refill: 1  3. Attention deficit hyperactivity disorder (ADHD), predominantly inattentive type PDMP reviewed  - amphetamine -dextroamphetamine  (ADDERALL) 15 MG tablet; Take 1 tablet by mouth 2 (two) times daily with a meal.  Dispense: 60 tablet; Refill: 0  Patient to follow up in 6 weeks Provider spent a total of 32 minutes with the patient/reviewing the patient's chart  Gates Kasal, PA 10/23/2023, 8:13 PM

## 2023-12-04 ENCOUNTER — Ambulatory Visit (HOSPITAL_COMMUNITY): Admitting: Physician Assistant

## 2023-12-04 DIAGNOSIS — F900 Attention-deficit hyperactivity disorder, predominantly inattentive type: Secondary | ICD-10-CM

## 2023-12-04 DIAGNOSIS — F331 Major depressive disorder, recurrent, moderate: Secondary | ICD-10-CM | POA: Diagnosis not present

## 2023-12-04 DIAGNOSIS — F411 Generalized anxiety disorder: Secondary | ICD-10-CM

## 2023-12-04 NOTE — Progress Notes (Signed)
 [facility] MD/PA/NP OP Progress Note  Virtual Visit via Video Note  I connected with Kelly Walsh on 12/04/23 at  4:30 PM EDT by a video enabled telemedicine application and verified that I am speaking with the correct person using two identifiers.  Location: Patient: Home Provider: Clinic   I discussed the limitations of evaluation and management by telemedicine and the availability of in person appointments. The patient expressed understanding and agreed to proceed.  Follow Up Instructions:  I discussed the assessment and treatment plan with the patient. The patient was provided an opportunity to ask questions and all were answered. The patient agreed with the plan and demonstrated an understanding of the instructions.   The patient was advised to call back or seek an in-person evaluation if the symptoms worsen or if the condition fails to improve as anticipated.  I provided 29 minutes of non-face-to-face time during this encounter.  Gates Kasal, PA    12/04/2023 4:45 PM Kelly Walsh  MRN:  [id number]  Chief Complaint:  Chief Complaint  Patient presents with   Follow-up   Medication Management   HPI:   Kelly Chojnowski. Walsh is a 43 year old, African-American female with a past psychiatric history significant for attention deficit disorder (unspecified hyperactivity present), anxiety, and major depressive disorder who presents to [facility] via virtual video visit for follow-up and medication management.  Patient is currently being managed on the following psychiatric medications:   Prozac  20 mg daily Abilify  10 mg daily Amphetamine -dextroamphetamine  (Adderall) 15 mg daily 2 times daily  Patient presents to the encounter stating that she continues to take her medications regularly.  She denies experiencing any negative side effects with her use of Prozac .  She denies experiencing fatigue and states that the medication does not make her feel  wired.  Patient reports that her use of Prozac  has been helpful with managing her anxiety but she still continues to endorse some issues with depression.  Patient is interested in increasing her dosage of Prozac  following the conclusion of the encounter.  Patient endorses some anxiety and rates her anxiety a 5 out of 10.  Patient denies any new stressors associated with her anxiety.  Patient endorses depression and rates her depression an 8 out of 10 with 10 being most severe.  She reports that her depression has improved with the changing of daylight saving time but does not believe that it has improved since the adjustment of her Prozac .  Patient endorses depressive episodes every day.  Patient endorses the following depressive symptoms: lack of motivation, fatigue, difficulty getting things done inside and outside of the home.  Patient denies feelings of sadness.  She reports that doing things outside of the home has become virtually nonexistent.  A PHQ-9 screen was performed with the patient scoring a 17.  A GAD-7 screen was also performed with the patient scoring a 14.  Patient reports that her use of Adderall has been helpful.  She denies experiencing crashing with the use of Adderall 15 mg daily.  She reports that the medication lasts a significant portion of the day.  Patient denies adverse side effects from her use of Adderall.  She does report that she occasionally experiences migraines if she drinks caffeine, does not drink water, or receives poor sleep the night before.  Patient is alert and oriented x 4, calm, cooperative, and fully engaged in conversation during the encounter.  Patient endorses neutral mood but states that she is tired.  Patient exhibits depressed mood with congruent affect.  Patient denies suicidal or homicidal ideations.  She further denies auditory or visual hallucinations and does not appear to be responding to internal/external stimuli.  Patient reports that her sleep has  been off and that she occasionally is awake at night.  She reports that it is not uncommon for her to sleep part of the day.  She reports that she sleeps a total of 8 hours a day, but does not sleep a full 8 hours at night.  Patient endorses good appetite and eats on average 3 meals per day.  Patient denies alcohol consumption, tobacco use, or illicit drug use.  Visit Diagnosis:    ICD-10-CM   1. Moderate episode of recurrent major depressive disorder (HCC)  F33.1 ARIPiprazole  (ABILIFY ) 10 MG tablet    FLUoxetine  (PROZAC ) 40 MG capsule    2. Anxiety state  F41.1 FLUoxetine  (PROZAC ) 40 MG capsule    3. Attention deficit hyperactivity disorder (ADHD), predominantly inattentive type  F90.0 amphetamine -dextroamphetamine  (ADDERALL) 15 MG tablet     Past Psychiatric History:  Patient has a past psychiatric history significant for depression, anxiety, and ADD (unspecified hyperactivity present)   Patient denies a past history of hospitalization due to mental health   Patient denies a past history of suicide attempt Patient denies a past history of homicide attempt  Past Medical History:  Past Medical History:  Diagnosis Date   Anemia    H/O   Hx of migraines    IBS (irritable bowel syndrome)     Past Surgical History:  Procedure Laterality Date   FOOT SURGERY Right    WISDOM TOOTH EXTRACTION      Family Psychiatric History:  Patient is unsure of family history of psychiatric illness   Family history of suicide attempt: Patient denies Family history of homicide: Patient denies Family history of substance abuse: Patient endorses a family history of substance abuse but is unsure of the details  Family History:  Family History  Problem Relation Age of Onset   Diabetes Father    Hypertension Mother    Lupus Cousin     Social History:  Social History   Socioeconomic History   Marital status: Single    Spouse name: Not on file   Number of children: 0   Years of education:  Not on file   Highest education level: Some college, no degree  Occupational History   Not on file  Tobacco Use   Smoking status: Never   Smokeless tobacco: Never  Vaping Use   Vaping status: Never Used  Substance and Sexual Activity   Alcohol use: No   Drug use: No   Sexual activity: Yes    Birth control/protection: Condom  Other Topics Concern   Not on file  Social History Narrative   Right handed, lives in an apartment on 2nd floor alone.   Pt drinks coffee, occasionally hot tea, soda sometimes, water   No children   Does not exercises regularly    Social Drivers of Corporate investment banker Strain: Not on file  Food Insecurity: Not on file  Transportation Needs: Not on file  Physical Activity: Not on file  Stress: Not on file  Social Connections: Not on file    Allergies:  Allergies  Allergen Reactions   Bupropion Hives   Metformin Nausea Only   Oxycodone-Acetaminophen  Itching   Tramadol  Itching   Codeine    Oxycodone    Tape     Adhesive  allergy     Metabolic Disorder Labs: Lab Results  Component Value Date   HGBA1C 5.1 09/09/2023   No results found for: "PROLACTIN" Lab Results  Component Value Date   CHOL 173 09/09/2023   TRIG 133 09/09/2023   HDL 52 09/09/2023   CHOLHDL 3.3 09/09/2023   LDLCALC 98 09/09/2023   Lab Results  Component Value Date   TSH 5.72 (H) 04/03/2023   TSH 3.45 08/03/2022    Therapeutic Level Labs: No results found for: "LITHIUM" No results found for: "VALPROATE" No results found for: "CBMZ"  Current Medications: Current Outpatient Medications  Medication Sig Dispense Refill   amphetamine -dextroamphetamine  (ADDERALL) 15 MG tablet Take 1 tablet by mouth 2 (two) times daily with a meal. 60 tablet 0   ARIPiprazole  (ABILIFY ) 10 MG tablet Take 1 tablet (10 mg total) by mouth daily. 30 tablet 1   FLUoxetine  (PROZAC ) 40 MG capsule Take 1 capsule (40 mg total) by mouth daily. 30 capsule 1   fluticasone (FLONASE) 50  MCG/ACT nasal spray Place 2 sprays into the nose daily.     levothyroxine  (SYNTHROID ) 25 MCG tablet Take 1 tablet (25 mcg total) by mouth daily. 30 tablet 3   loratadine (CLARITIN) 10 MG tablet Take 10 mg by mouth daily.     naproxen sodium (ALEVE) 220 MG tablet Take 220 mg by mouth as needed.     nystatin -triamcinolone  (MYCOLOG II) cream Apply 1 Application topically 2 (two) times daily. 30 g 0   Probiotic Product (PROBIOTIC DAILY PO) Take by mouth.     Ubrogepant  (UBRELVY ) 100 MG TABS Take 100 mg by mouth as needed (May repeat dose in 2 hours.  Maximum 2 tablets in 24 hours.). 10 tablet 5   No current facility-administered medications for this visit.     Musculoskeletal: Strength & Muscle Tone: within normal limits Gait & Station: normal Patient leans: N/A  Psychiatric Specialty Exam: Review of Systems  Psychiatric/Behavioral:  Positive for dysphoric mood and sleep disturbance. Negative for decreased concentration, hallucinations, self-injury and suicidal ideas. The patient is nervous/anxious. The patient is not hyperactive.     Blood pressure 137/77, pulse (!) 114, temperature 98.3 F (36.8 C), temperature source Oral, height 5\' 5"  (1.651 m), weight 181 lb 3.2 oz (82.2 kg), SpO2 100%.Body mass index is 30.15 kg/m.  General Appearance: Casual  Eye Contact:  Good  Speech:  Clear and Coherent and Normal Rate  Volume:  Normal  Mood:  Anxious and Depressed  Affect:  Congruent  Thought Process:  Coherent, Goal Directed, and Descriptions of Associations: Intact  Orientation:  Full (Time, Place, and Person)  Thought Content: WDL   Suicidal Thoughts:  No  Homicidal Thoughts:  No  Memory:  Immediate;   Good Recent;   Good Remote;   Good  Judgement:  Good  Insight:  Fair  Psychomotor Activity:  Normal  Concentration:  Concentration: Good and Attention Span: Good  Recall:  Good  Fund of Knowledge: Good  Language: Good  Akathisia:  No  Handed:  Right  AIMS (if indicated): not  done  Assets:  Communication Skills Desire for Improvement Financial Resources/Insurance Housing Physical Health Social Support Transportation  ADL's:  Intact  Cognition: WNL  Sleep:  Fair   Screenings: AIMS    Flowsheet Row Clinical Support from 12/04/2023 in [facility]  AIMS Total Score 1      GAD-7    Flowsheet Row Clinical Support from 12/04/2023 in [facility] Clinical  Support from 10/23/2023 in [facility] Clinical Support from 09/11/2023 in [facility] Video Visit from 07/24/2023 in [facility] Clinical Support from 04/18/2023 in [facility]  Total GAD-7 Score 14 17 6 6 11       PHQ2-9    Flowsheet Row Clinical Support from 12/04/2023 in [facility] Clinical Support from 10/23/2023 in [facility] Clinical Support from 09/11/2023 in [facility] Video Visit from 07/24/2023 in [facility] Clinical Support from 04/18/2023 in [facility]  PHQ-2 Total Score 6 5 6 5 4   PHQ-9 Total Score 17 18 19 16 15       Flowsheet Row Clinical Support from 12/04/2023 in [facility] Clinical Support from 10/23/2023 in [facility] Clinical Support from 09/11/2023 in [facility]  C-SSRS RISK CATEGORY No Risk No Risk No Risk        Assessment and Plan:   Harvey Lingo. Kozuch is a 43 year old, African-American female with a past psychiatric history significant for attention deficit disorder (unspecified hyperactivity present), anxiety, and major depressive disorder who presents to [facility] via virtual video visit for follow-up and medication management.  Patient reports  that she continues to take her medications regularly.  An aims assessment was performed with the patient scoring a 1.  Patient reports that her use of Prozac  has been helpful in managing her anxiety but states that she still continues to experience depressive symptoms.  She reports that her depression ha slightly improved due to there being more sunlight during the day.  She continues to endorse depression characterized by lack of motivation, fatigue, and not engaging in activities of daily living.  A PHQ-9 screen was performed with the patient scoring a 17.  A GAD-7 screen was performed with the patient scoring a 14.  Patient is interested in adjusting her dosage of Prozac  for the management of her depression and anxiety.  Provider recommended increasing her Prozac  dosage from 20 mg to 40 mg daily for the management of her depression and anxiety.  Patient was agreeable to recommendation.  Patient's medication to be prescribed to pharmacy of choice.  Patient reports that she continues to use the Adderall 15 mg daily and states that the medication has been helpful in managing her focus and concentration without feeling the sensation of crashing.  Patient denies experiencing any adverse side effects from her use of Adderall.  Patient to continue taking her Adderall as well as her other medication (Abilify ) as prescribed.  Patient denies suicidal ideations and is able to contract for safety following the conclusion of the encounter.  Collaboration of Care: Collaboration of Care: Medication Management AEB provider managing patient's psychiatric medications and Psychiatrist AEB patient being followed by a mental health provider  Patient/Guardian was advised Release of Information must be obtained prior to any record release in order to collaborate their care with an outside provider. Patient/Guardian was advised if they have not already done so to contact the registration department to sign all necessary forms  in order for us  to release information regarding their care.   Consent: Patient/Guardian gives verbal consent for treatment and assignment of benefits for services provided during this visit. Patient/Guardian expressed understanding and agreed to proceed.   1. Moderate episode of recurrent major depressive disorder (HCC)  - ARIPiprazole  (ABILIFY )  10 MG tablet; Take 1 tablet (10 mg total) by mouth daily.  Dispense: 30 tablet; Refill: 1 - FLUoxetine  (PROZAC ) 40 MG capsule; Take 1 capsule (40 mg total) by mouth daily.  Dispense: 30 capsule; Refill: 1  2. Anxiety state  - FLUoxetine  (PROZAC ) 40 MG capsule; Take 1 capsule (40 mg total) by mouth daily.  Dispense: 30 capsule; Refill: 1  3. Attention deficit hyperactivity disorder (ADHD), predominantly inattentive type  - amphetamine -dextroamphetamine  (ADDERALL) 15 MG tablet; Take 1 tablet by mouth 2 (two) times daily with a meal.  Dispense: 60 tablet; Refill: 0  Patient to follow up in 6 weeks Provider spent a total of 29 minutes with the patient/reviewing the patient's chart  Gates Kasal, PA 12/04/2023, 4:45 PM

## 2023-12-05 ENCOUNTER — Telehealth (HOSPITAL_COMMUNITY): Payer: Self-pay

## 2023-12-05 ENCOUNTER — Encounter (HOSPITAL_COMMUNITY): Payer: Self-pay | Admitting: Physician Assistant

## 2023-12-05 MED ORDER — FLUOXETINE HCL 40 MG PO CAPS: 40.0000 mg | ORAL_CAPSULE | Freq: Every day | ORAL | 1 refills | Status: DC

## 2023-12-05 MED ORDER — AMPHETAMINE-DEXTROAMPHETAMINE 15 MG PO TABS: 15.0000 mg | ORAL_TABLET | Freq: Two times a day (BID) | ORAL | 0 refills | Status: DC

## 2023-12-05 MED ORDER — ARIPIPRAZOLE 10 MG PO TABS
10.0000 mg | ORAL_TABLET | Freq: Every day | ORAL | 1 refills | Status: DC
Start: 2023-12-05 — End: 2024-01-16

## 2023-12-05 NOTE — Telephone Encounter (Signed)
 Hello,    Pt/Pharmacy is requesting for a refill of Hydroxyzine to be sent to pharmacy.   Last refill was 08/12/2023  JNL, CMA

## 2024-01-15 ENCOUNTER — Ambulatory Visit (INDEPENDENT_AMBULATORY_CARE_PROVIDER_SITE_OTHER): Admitting: Physician Assistant

## 2024-01-15 DIAGNOSIS — F900 Attention-deficit hyperactivity disorder, predominantly inattentive type: Secondary | ICD-10-CM | POA: Diagnosis not present

## 2024-01-15 DIAGNOSIS — F331 Major depressive disorder, recurrent, moderate: Secondary | ICD-10-CM

## 2024-01-15 DIAGNOSIS — F411 Generalized anxiety disorder: Secondary | ICD-10-CM

## 2024-02-09 MED ORDER — ARIPIPRAZOLE 10 MG PO TABS: 10.0000 mg | ORAL_TABLET | Freq: Every day | ORAL | 1 refills | Status: DC

## 2024-02-09 MED ORDER — FLUOXETINE HCL 40 MG PO CAPS
40.0000 mg | ORAL_CAPSULE | Freq: Every day | ORAL | 1 refills | Status: DC
Start: 2024-02-09 — End: 2024-02-26

## 2024-02-09 MED ORDER — AMPHETAMINE-DEXTROAMPHETAMINE 15 MG PO TABS
15.0000 mg | ORAL_TABLET | Freq: Two times a day (BID) | ORAL | 0 refills | Status: DC
Start: 2024-02-09 — End: 2024-02-26

## 2024-02-09 NOTE — Progress Notes (Signed)
 [facility] MD/PA/NP OP Progress Note  01/15/2024 4:30 PM Kelly Walsh  MRN:  [id number]  Chief Complaint:  Chief Complaint  Patient presents with   Follow-up   Medication Refill   HPI:   Kelly Walsh is a 43 year old, African-American female with a past psychiatric history significant for attention deficit disorder (unspecified hyperactivity present), anxiety, and major depressive disorder who presents to [facility] for follow-up and medication management.  Patient is currently being managed on the following psychiatric medications:   Prozac  40 mg daily Abilify  10 mg daily Amphetamine -dextroamphetamine  (Adderall) 15 mg daily 2 times daily  Patient presents to encounter stating that her Adderall is still working.  She notes that the medication goes into effect 15 minutes to an hour after taking it.  Patient denies experiencing any adverse side effects associated with the use of her Adderall.  She does report that if she takes it too many days in a row, she may experience migraines.  Patient endorses depression and rates her depression a 9 out of 10 with 10 being most severe.  Patient endorses depressive episodes every day.  Patient endorses the following depressive symptoms: lack of motivation, neglecting activities of daily living, and not leaving the house.  Patient reports that she has not done much in the last 2 weeks due to her depression.  Patient continues to endorse anxiety over a few things in her life but states that she is able to manage.  Though patient reports that she has not been sleeping all day like she used to, she has no desire to do things that she has to do.  Patient denies suicidal ideations.  Patient reports that her anxiety is night and day and that her medications have significantly changed her anxiety.  A PHQ-9 screen was performed with the patient scoring a 15.  A GAD-7 screen was also performed with the patient scoring a  12.  Patient is alert and oriented x 4, calm, cooperative, and fully engaged in conversation during the encounter.  Patient describes her mood as blah.  Patient exhibits depressed mood with appropriate affect.  Patient denies suicidal or homicidal ideations.  She further denies auditory or visual hallucinations and does not appear to be responding to internal/external stimuli.  Patient endorses good sleep and receives on average 6 to 8 hours of sleep per night.  Patient endorses fair appetite and eats on average 2 meals per day as well as snacking throughout the day.  Patient denies alcohol consumption, tobacco use, or illicit drug use.  Visit Diagnosis:    ICD-10-CM   1. Moderate episode of recurrent major depressive disorder (HCC)  F33.1 ARIPiprazole  (ABILIFY ) 10 MG tablet    FLUoxetine  (PROZAC ) 40 MG capsule    2. Attention deficit hyperactivity disorder (ADHD), predominantly inattentive type  F90.0 amphetamine -dextroamphetamine  (ADDERALL) 15 MG tablet    3. Anxiety state  F41.1 FLUoxetine  (PROZAC ) 40 MG capsule     Past Psychiatric History:  Patient has a past psychiatric history significant for depression, anxiety, and ADD (unspecified hyperactivity present)   Patient denies a past history of hospitalization due to mental health   Patient denies a past history of suicide attempt Patient denies a past history of homicide attempt  Past Medical History:  Past Medical History:  Diagnosis Date   Anemia    H/O   Hx of migraines    IBS (irritable bowel syndrome)     Past Surgical History:  Procedure Laterality Date  FOOT SURGERY Right    WISDOM TOOTH EXTRACTION      Family Psychiatric History:  Patient is unsure of family history of psychiatric illness   Family history of suicide attempt: Patient denies Family history of homicide: Patient denies Family history of substance abuse: Patient endorses a family history of substance abuse but is unsure of the details  Family  History:  Family History  Problem Relation Age of Onset   Diabetes Father    Hypertension Mother    Lupus Cousin     Social History:  Social History   Socioeconomic History   Marital status: Single    Spouse name: Not on file   Number of children: 0   Years of education: Not on file   Highest education level: Some college, no degree  Occupational History   Not on file  Tobacco Use   Smoking status: Never   Smokeless tobacco: Never  Vaping Use   Vaping status: Never Used  Substance and Sexual Activity   Alcohol use: No   Drug use: No   Sexual activity: Yes    Birth control/protection: Condom  Other Topics Concern   Not on file  Social History Narrative   Right handed, lives in an apartment on 2nd floor alone.   Pt drinks coffee, occasionally hot tea, soda sometimes, water   No children   Does not exercises regularly    Social Drivers of Corporate investment banker Strain: Not on file  Food Insecurity: Not on file  Transportation Needs: Not on file  Physical Activity: Not on file  Stress: Not on file  Social Connections: Not on file    Allergies:  Allergies  Allergen Reactions   Bupropion Hives   Metformin Nausea Only   Oxycodone-Acetaminophen  Itching   Tramadol  Itching   Codeine    Oxycodone    Tape     Adhesive allergy     Metabolic Disorder Labs: Lab Results  Component Value Date   HGBA1C 5.1 09/09/2023   No results found for: PROLACTIN Lab Results  Component Value Date   CHOL 173 09/09/2023   TRIG 133 09/09/2023   HDL 52 09/09/2023   CHOLHDL 3.3 09/09/2023   LDLCALC 98 09/09/2023   Lab Results  Component Value Date   TSH 5.72 (H) 04/03/2023   TSH 3.45 08/03/2022    Therapeutic Level Labs: No results found for: LITHIUM No results found for: VALPROATE No results found for: CBMZ  Current Medications: Current Outpatient Medications  Medication Sig Dispense Refill   amphetamine -dextroamphetamine  (ADDERALL) 15 MG tablet Take  1 tablet by mouth 2 (two) times daily with a meal. 60 tablet 0   ARIPiprazole  (ABILIFY ) 10 MG tablet Take 1 tablet (10 mg total) by mouth daily. 30 tablet 1   FLUoxetine  (PROZAC ) 40 MG capsule Take 1 capsule (40 mg total) by mouth daily. 30 capsule 1   fluticasone (FLONASE) 50 MCG/ACT nasal spray Place 2 sprays into the nose daily.     levothyroxine  (SYNTHROID ) 25 MCG tablet Take 1 tablet (25 mcg total) by mouth daily. 30 tablet 3   loratadine (CLARITIN) 10 MG tablet Take 10 mg by mouth daily.     naproxen sodium (ALEVE) 220 MG tablet Take 220 mg by mouth as needed.     nystatin -triamcinolone  (MYCOLOG II) cream Apply 1 Application topically 2 (two) times daily. 30 g 0   Probiotic Product (PROBIOTIC DAILY PO) Take by mouth.     Ubrogepant  (UBRELVY ) 100 MG TABS Take 100  mg by mouth as needed (May repeat dose in 2 hours.  Maximum 2 tablets in 24 hours.). 10 tablet 5   No current facility-administered medications for this visit.     Musculoskeletal: Strength & Muscle Tone: within normal limits Gait & Station: normal Patient leans: N/A  Psychiatric Specialty Exam: Review of Systems  Psychiatric/Behavioral:  Positive for dysphoric mood and sleep disturbance. Negative for decreased concentration, hallucinations, self-injury and suicidal ideas. The patient is nervous/anxious. The patient is not hyperactive.     Blood pressure 135/77, pulse (!) 119, temperature 98.7 F (37.1 C), temperature source Oral, height 5' 5 (1.651 m), weight 177 lb 9.6 oz (80.6 kg), SpO2 100%.Body mass index is 29.55 kg/m.  General Appearance: Casual  Eye Contact:  Good  Speech:  Clear and Coherent and Normal Rate  Volume:  Normal  Mood:  Anxious and Depressed  Affect:  Congruent  Thought Process:  Coherent, Goal Directed, and Descriptions of Associations: Intact  Orientation:  Full (Time, Place, and Person)  Thought Content: WDL   Suicidal Thoughts:  No  Homicidal Thoughts:  No  Memory:  Immediate;    Good Recent;   Good Remote;   Good  Judgement:  Good  Insight:  Fair  Psychomotor Activity:  Normal  Concentration:  Concentration: Good and Attention Span: Good  Recall:  Good  Fund of Knowledge: Good  Language: Good  Akathisia:  No  Handed:  Right  AIMS (if indicated): not done  Assets:  Communication Skills Desire for Improvement Financial Resources/Insurance Housing Physical Health Social Support Transportation  ADL's:  Intact  Cognition: WNL  Sleep:  Fair   Screenings: AIMS    Flowsheet Row Clinical Support from 01/15/2024 in [facility] Clinical Support from 12/04/2023 in [facility]  AIMS Total Score 2 1   GAD-7    Flowsheet Row Clinical Support from 01/15/2024 in [facility] Clinical Support from 12/04/2023 in [facility] Clinical Support from 10/23/2023 in [facility] Clinical Support from 09/11/2023 in [facility] Video Visit from 07/24/2023 in [facility]  Total GAD-7 Score 12 14 17 6 6    PHQ2-9    Flowsheet Row Clinical Support from 01/15/2024 in [facility] Clinical Support from 12/04/2023 in [facility] Clinical Support from 10/23/2023 in [facility] Clinical Support from 09/11/2023 in [facility] Video Visit from 07/24/2023 in [facility]  PHQ-2 Total Score 6 6 5 6 5   PHQ-9 Total Score 15 17 18 19 16    Flowsheet Row Clinical Support from 01/15/2024 in [facility] Clinical Support from 12/04/2023 in [facility] Clinical Support from 10/23/2023 in [facility]  C-SSRS RISK CATEGORY No Risk No Risk No Risk     Assessment and Plan:   Evanie Buckle. Deckman  is a 43 year old, African-American female with a past psychiatric history significant for attention deficit disorder (unspecified hyperactivity present), anxiety, and major depressive disorder who presents to [facility] for follow-up and medication management.  Patient presents to the encounter stating that she continues to take her medications regularly and denies experiencing any adverse side effects.  An aims assessment was performed with the patient scoring a 2.  Patient states that her movements are minimal and denies the need for medication management  at this time.  Provider will continue to monitor if patient's involuntary movements worsen.  Patient reports that her Adderall continues to be helpful in managing her ADHD symptoms.  Patient denies experiencing adverse side effects from taking her medication.  Patient reports that she still continues to experience depression characterized by lack of motivation, neglecting activities of daily living, or leaving the house.  She reports that her anxiety is still present but has been more manageable since the introduction of her medications.  A PHQ-9 screen was performed with the patient scoring a 15.  A GAD-7 screen was also performed with the patient scoring a 12.  Though patient continues to endorse depression and anxiety, she would like to continue taking her medications as prescribed.  Patient's medications to be e-prescribed to pharmacy of choice.  A [place] Suicide Severity Rating Scale was performed with the patient being considered no risk.  Patient denies suicidal ideations and is able to contract for safety at this time.    Collaboration of Care: Collaboration of Care: Medication Management AEB provider managing patient's psychiatric medications and Psychiatrist AEB patient being followed by a mental health provider  Patient/Guardian was advised Release of Information must be obtained prior to any record  release in order to collaborate their care with an outside provider. Patient/Guardian was advised if they have not already done so to contact the registration department to sign all necessary forms in order for us  to release information regarding their care.   Consent: Patient/Guardian gives verbal consent for treatment and assignment of benefits for services provided during this visit. Patient/Guardian expressed understanding and agreed to proceed.   1. Moderate episode of recurrent major depressive disorder (HCC)  - ARIPiprazole  (ABILIFY ) 10 MG tablet; Take 1 tablet (10 mg total) by mouth daily.  Dispense: 30 tablet; Refill: 1 - FLUoxetine  (PROZAC ) 40 MG capsule; Take 1 capsule (40 mg total) by mouth daily.  Dispense: 30 capsule; Refill: 1  2. Attention deficit hyperactivity disorder (ADHD), predominantly inattentive type  - amphetamine -dextroamphetamine  (ADDERALL) 15 MG tablet; Take 1 tablet by mouth 2 (two) times daily with a meal.  Dispense: 60 tablet; Refill: 0  3. Anxiety state  - FLUoxetine  (PROZAC ) 40 MG capsule; Take 1 capsule (40 mg total) by mouth daily.  Dispense: 30 capsule; Refill: 1  Patient to follow up in 6 weeks Provider spent a total of 27 minutes with the patient/reviewing the patient's chart  Reginia FORBES Bolster, PA 01/15/2024, 4:30 PM

## 2024-02-24 ENCOUNTER — Ambulatory Visit (INDEPENDENT_AMBULATORY_CARE_PROVIDER_SITE_OTHER): Admitting: Physician Assistant

## 2024-02-24 DIAGNOSIS — F411 Generalized anxiety disorder: Secondary | ICD-10-CM | POA: Diagnosis not present

## 2024-02-24 DIAGNOSIS — F900 Attention-deficit hyperactivity disorder, predominantly inattentive type: Secondary | ICD-10-CM | POA: Diagnosis not present

## 2024-02-24 DIAGNOSIS — F331 Major depressive disorder, recurrent, moderate: Secondary | ICD-10-CM | POA: Diagnosis not present

## 2024-02-25 NOTE — Progress Notes (Signed)
 [facility] MD/PA/NP OP Progress Note  02/24/2024 4:30 PM Kelly Walsh  MRN:  [id number]  Chief Complaint:  Chief Complaint  Patient presents with   Follow-up   Medication Refill   HPI:   Kelly Walsh is a 43 year old, African-American female with a past psychiatric history significant for attention deficit disorder (unspecified hyperactivity present), anxiety, and major depressive disorder who presents to [facility] for follow-up and medication management.  Patient is currently being managed on the following psychiatric medications:   Prozac  40 mg daily Abilify  10 mg daily Amphetamine -dextroamphetamine  (Adderall) 15 mg daily 2 times daily  Patient presents to the encounter stating that things have remained relatively the same in her life.  She reports that she continues to take her Adderall prescription regularly and denies experiencing any adverse side effects associated with the medication.  Patient reports that the medication goes into effect 15 minutes to an hour after taking it.  She reports that her depression has worsened lately and rates her depression a 9 out of 10 with 10 being most severe.  Patient reports that she has been struggling more than usual.  Patient denies any discernible triggers to her depression.  She endorses depressive episodes every day.  She reports that on occasion, she is either awake most of the day or either sleeping the majority of the day.  Patient endorses the following depressive symptoms: crying spells (rarely), sadness, lack of motivation, decreased energy, irritability (improving), feelings of guilt/worthlessness, and hopelessness.  Patient also endorses anxiety and rates her anxiety a 9 out of 10.  Patient denies any discernible triggers to her anxiety and states that she has felt this way for the last 2 weeks.  Patient reports that she feels like she is unable to function.  A PHQ-9 screen was performed with the  patient scoring a 17.  A GAD-7 screen was also performed with the patient scoring an 18.  Though patient continues to endorse worsening depression and anxiety, she feels that she is in a better place than she was when she first started coming to this facility.  She reports that she does not feel like she can function at the level of a normal person.  She reports that she is still unable to get things done and when she has to do things, she has to do it by force.  Patient is alert and oriented x 4, calm, cooperative, and fully engaged in conversation during the encounter.  Patient reports that she has not been having a great day.  Patient exhibits depressed mood with congruent affect.  Patient denies suicidal or homicidal ideations.  She further denies auditory or visual hallucinations and does not appear to be responding to internal/external stimuli.  Patient endorses fair sleep and receives on average 6 to 7 hours of sleep per night.  She reports that she will often nap during the day.  Patient endorses fair appetite and eats on average 2 meals along with snacking during the day.  Patient denies alcohol consumption, tobacco use, or illicit drug use.  Visit Diagnosis:    ICD-10-CM   1. Moderate episode of recurrent major depressive disorder (HCC)  F33.1 ARIPiprazole  (ABILIFY ) 10 MG tablet    FLUoxetine  (PROZAC ) 40 MG capsule    2. Attention deficit hyperactivity disorder (ADHD), predominantly inattentive type  F90.0 amphetamine -dextroamphetamine  (ADDERALL) 15 MG tablet    3. Anxiety state  F41.1 FLUoxetine  (PROZAC ) 40 MG capsule      Past Psychiatric History:  Patient has a past psychiatric history significant for depression, anxiety, and ADD (unspecified hyperactivity present)   Patient denies a past history of hospitalization due to mental health   Patient denies a past history of suicide attempt Patient denies a past history of homicide attempt  Past Medical History:  Past Medical  History:  Diagnosis Date   Anemia    H/O   Hx of migraines    IBS (irritable bowel syndrome)     Past Surgical History:  Procedure Laterality Date   FOOT SURGERY Right    WISDOM TOOTH EXTRACTION      Family Psychiatric History:  Patient is unsure of family history of psychiatric illness   Family history of suicide attempt: Patient denies Family history of homicide: Patient denies Family history of substance abuse: Patient endorses a family history of substance abuse but is unsure of the details  Family History:  Family History  Problem Relation Age of Onset   Diabetes Father    Hypertension Mother    Lupus Cousin     Social History:  Social History   Socioeconomic History   Marital status: Single    Spouse name: Not on file   Number of children: 0   Years of education: Not on file   Highest education level: Some college, no degree  Occupational History   Not on file  Tobacco Use   Smoking status: Never   Smokeless tobacco: Never  Vaping Use   Vaping status: Never Used  Substance and Sexual Activity   Alcohol use: No   Drug use: No   Sexual activity: Yes    Birth control/protection: Condom  Other Topics Concern   Not on file  Social History Narrative   Right handed, lives in an apartment on 2nd floor alone.   Pt drinks coffee, occasionally hot tea, soda sometimes, water   No children   Does not exercises regularly    Social Drivers of Corporate investment banker Strain: Not on file  Food Insecurity: Not on file  Transportation Needs: Not on file  Physical Activity: Not on file  Stress: Not on file  Social Connections: Not on file    Allergies:  Allergies  Allergen Reactions   Bupropion Hives   Metformin Nausea Only   Oxycodone-Acetaminophen  Itching   Tramadol  Itching   Codeine    Oxycodone    Tape     Adhesive allergy     Metabolic Disorder Labs: Lab Results  Component Value Date   HGBA1C 5.1 09/09/2023   No results found for:  PROLACTIN Lab Results  Component Value Date   CHOL 173 09/09/2023   TRIG 133 09/09/2023   HDL 52 09/09/2023   CHOLHDL 3.3 09/09/2023   LDLCALC 98 09/09/2023   Lab Results  Component Value Date   TSH 5.72 (H) 04/03/2023   TSH 3.45 08/03/2022    Therapeutic Level Labs: No results found for: LITHIUM No results found for: VALPROATE No results found for: CBMZ  Current Medications: Current Outpatient Medications  Medication Sig Dispense Refill   [START ON 03/10/2024] amphetamine -dextroamphetamine  (ADDERALL) 15 MG tablet Take 1 tablet by mouth 2 (two) times daily with a meal. 60 tablet 0   ARIPiprazole  (ABILIFY ) 10 MG tablet Take 1 tablet (10 mg total) by mouth daily. 30 tablet 1   FLUoxetine  (PROZAC ) 40 MG capsule Take 1 capsule (40 mg total) by mouth daily. 30 capsule 1   fluticasone (FLONASE) 50 MCG/ACT nasal spray Place 2 sprays into the nose  daily.     levothyroxine  (SYNTHROID ) 25 MCG tablet Take 1 tablet (25 mcg total) by mouth daily. 30 tablet 3   loratadine (CLARITIN) 10 MG tablet Take 10 mg by mouth daily.     naproxen sodium (ALEVE) 220 MG tablet Take 220 mg by mouth as needed.     nystatin -triamcinolone  (MYCOLOG II) cream Apply 1 Application topically 2 (two) times daily. 30 g 0   Probiotic Product (PROBIOTIC DAILY PO) Take by mouth.     Ubrogepant  (UBRELVY ) 100 MG TABS Take 100 mg by mouth as needed (May repeat dose in 2 hours.  Maximum 2 tablets in 24 hours.). 10 tablet 5   No current facility-administered medications for this visit.     Musculoskeletal: Strength & Muscle Tone: within normal limits Gait & Station: normal Patient leans: N/A  Psychiatric Specialty Exam: Review of Systems  Psychiatric/Behavioral:  Positive for dysphoric mood and sleep disturbance. Negative for decreased concentration, hallucinations, self-injury and suicidal ideas. The patient is nervous/anxious. The patient is not hyperactive.     Blood pressure 136/75, pulse (!) 125, height  5' 5 (1.651 m), weight 173 lb 12.8 oz (78.8 kg), SpO2 97%.Body mass index is 28.92 kg/m.  General Appearance: Casual  Eye Contact:  Good  Speech:  Clear and Coherent and Normal Rate  Volume:  Normal  Mood:  Anxious and Depressed  Affect:  Congruent  Thought Process:  Coherent, Goal Directed, and Descriptions of Associations: Intact  Orientation:  Full (Time, Place, and Person)  Thought Content: WDL   Suicidal Thoughts:  No  Homicidal Thoughts:  No  Memory:  Immediate;   Good Recent;   Good Remote;   Good  Judgement:  Good  Insight:  Fair  Psychomotor Activity:  Normal  Concentration:  Concentration: Good and Attention Span: Good  Recall:  Good  Fund of Knowledge: Good  Language: Good  Akathisia:  No  Handed:  Right  AIMS (if indicated): not done  Assets:  Communication Skills Desire for Improvement Financial Resources/Insurance Housing Physical Health Social Support Transportation  ADL's:  Intact  Cognition: WNL  Sleep:  Fair   Screenings: AIMS    Flowsheet Row Clinical Support from 02/24/2024 in [facility] Clinical Support from 01/15/2024 in [facility] Clinical Support from 12/04/2023 in [place]  AIMS Total Score 2 2 1    GAD-7    Flowsheet Row Clinical Support from 02/24/2024 in [facility] Clinical Support from 01/15/2024 in [facility] Clinical Support from 12/04/2023 in [facility] Clinical Support from 10/23/2023 in [facility] Clinical Support from 09/11/2023 in [facility]  Total GAD-7 Score 18 12 14 17 6    PHQ2-9    Flowsheet Row Clinical Support from 02/24/2024 in [facility] Clinical Support from 01/15/2024 in [facility] Clinical Support from 12/04/2023 in [facility] Clinical Support from 10/23/2023 in [facility] Clinical Support from 09/11/2023 in [facility]  PHQ-2 Total Score 6 6 6 5 6   PHQ-9 Total Score 17 15 17 18 19    Flowsheet Row Clinical Support from 02/24/2024 in [facility] Clinical Support from 01/15/2024 in [facility] Clinical Support from 12/04/2023 in [facility]  C-SSRS RISK CATEGORY No Risk No Risk No Risk  Assessment and Plan:   Kelly Walsh is a 43 year old, African-American female with a past psychiatric history significant for attention deficit disorder (unspecified hyperactivity present), anxiety, and major depressive disorder who presents to [facility] for follow-up and medication management.  Patient reports that she continues to take her medication regularly and denies experiencing any adverse side effects.  Lately, patient reports that she has been experiencing worsening depression and anxiety.  Patient denies any discernible triggers to her depression and anxiety.  A PHQ-9 screen was performed with the patient scoring a 17.  A GAD-7 screen was also performed with the patient scoring an 18.  Provider suggested adjusting patient's Prozac  dosage for the management of her depressive symptoms and anxiety; however, patient refused dosage adjustment at this time.  She would like to continue taking her medications as prescribed and is interested in other alternatives for managing her mental health.  Provider discussed with patient that TMS may be an option for her to consider with managing her depression.  Patient verbalized understanding.  Patient to continue taking her medications as prescribed.  Patient medications to be e-prescribed to pharmacy of choice.  A [place] Suicide Severity Rating Scale was performed with the patient  being considered no risk.  Patient denies suicidal ideations and is able to contract for safety at this time.   Collaboration of Care: Collaboration of Care: Medication Management AEB provider managing patient's psychiatric medications and Psychiatrist AEB patient being followed by a mental health provider  Patient/Guardian was advised Release of Information must be obtained prior to any record release in order to collaborate their care with an outside provider. Patient/Guardian was advised if they have not already done so to contact the registration department to sign all necessary forms in order for us  to release information regarding their care.   Consent: Patient/Guardian gives verbal consent for treatment and assignment of benefits for services provided during this visit. Patient/Guardian expressed understanding and agreed to proceed.   1. Moderate episode of recurrent major depressive disorder (HCC)  - ARIPiprazole  (ABILIFY ) 10 MG tablet; Take 1 tablet (10 mg total) by mouth daily.  Dispense: 30 tablet; Refill: 1 - FLUoxetine  (PROZAC ) 40 MG capsule; Take 1 capsule (40 mg total) by mouth daily.  Dispense: 30 capsule; Refill: 1  2. Attention deficit hyperactivity disorder (ADHD), predominantly inattentive type  - amphetamine -dextroamphetamine  (ADDERALL) 15 MG tablet; Take 1 tablet by mouth 2 (two) times daily with a meal.  Dispense: 60 tablet; Refill: 0  3. Anxiety state  - FLUoxetine  (PROZAC ) 40 MG capsule; Take 1 capsule (40 mg total) by mouth daily.  Dispense: 30 capsule; Refill: 1  Patient to follow up in 6 weeks Provider spent a total of 28 minutes with the patient/reviewing the patient's chart  Kelly FORBES Bolster, PA 02/24/2024, 4:30 PM

## 2024-02-26 ENCOUNTER — Encounter (HOSPITAL_COMMUNITY): Payer: Self-pay | Admitting: Physician Assistant

## 2024-02-26 MED ORDER — FLUOXETINE HCL 40 MG PO CAPS: 40.0000 mg | ORAL_CAPSULE | Freq: Every day | ORAL | 1 refills | Status: DC

## 2024-02-26 MED ORDER — ARIPIPRAZOLE 10 MG PO TABS: 10.0000 mg | ORAL_TABLET | Freq: Every day | ORAL | 1 refills | Status: DC

## 2024-02-26 MED ORDER — AMPHETAMINE-DEXTROAMPHETAMINE 15 MG PO TABS: 15.0000 mg | ORAL_TABLET | Freq: Two times a day (BID) | ORAL | 0 refills | Status: DC

## 2024-04-02 ENCOUNTER — Ambulatory Visit (INDEPENDENT_AMBULATORY_CARE_PROVIDER_SITE_OTHER): Admitting: Physician Assistant

## 2024-04-02 DIAGNOSIS — F331 Major depressive disorder, recurrent, moderate: Secondary | ICD-10-CM

## 2024-04-02 DIAGNOSIS — F411 Generalized anxiety disorder: Secondary | ICD-10-CM | POA: Diagnosis not present

## 2024-04-02 DIAGNOSIS — F900 Attention-deficit hyperactivity disorder, predominantly inattentive type: Secondary | ICD-10-CM | POA: Diagnosis not present

## 2024-04-02 MED ORDER — FLUOXETINE HCL 40 MG PO CAPS: 40.0000 mg | ORAL_CAPSULE | Freq: Every day | ORAL | 1 refills | Status: DC

## 2024-04-02 MED ORDER — AMPHETAMINE-DEXTROAMPHETAMINE 15 MG PO TABS: 15.0000 mg | ORAL_TABLET | Freq: Two times a day (BID) | ORAL | 0 refills | Status: DC

## 2024-04-02 MED ORDER — ARIPIPRAZOLE 10 MG PO TABS
10.0000 mg | ORAL_TABLET | Freq: Every day | ORAL | 1 refills | Status: DC
Start: 2024-04-02 — End: 2024-05-20

## 2024-04-05 ENCOUNTER — Encounter (HOSPITAL_COMMUNITY): Payer: Self-pay | Admitting: Physician Assistant

## 2024-04-05 NOTE — Progress Notes (Unsigned)
 [facility] MD/PA/NP OP Progress Note  04/02/2024 4:00 PM Kelly Walsh  MRN:  [id number]  Chief Complaint:  Chief Complaint  Patient presents with   Follow-up   Medication Refill   HPI:   Kelly Walsh is a 43 year old, African-American female with a past psychiatric history significant for attention deficit disorder (unspecified hyperactivity present), anxiety, and major depressive disorder who presents to [facility] for follow-up and medication management.  Patient is currently being managed on the following psychiatric medications:   Prozac  40 mg daily Abilify  10 mg daily Amphetamine -dextroamphetamine  (Adderall) 15 mg daily 2 times daily  Patient presented to encounter stating that she has been taking her medications regularly and denies experiencing any adverse side effects.  Patient reports that she feels the same since last encounter.  She also reports that she continues to feel the same while on her Adderall prescription.  Provider discussed with patient options for managing her depression with the use of Spravato.  Patient is interested in determining if she would be a candidate for the use of Spravato; however, she is concerned about going through with the treatment and losing her Medicaid while undergoing treatment.  Patient was also concerned about her elevated heart rate with disqualify her from being able to use the medication.  Provider to reach out to [facility] facility to determine qualifications for patient to be on medication.  Patient continues to endorse depression and rates her depression an 8 out of 10 with 10 being most severe.  Patient endorses depressive episodes every day with symptoms lasting the whole day.  Patient endorses the following depressive symptoms: feelings of sadness, lack of motivation, decreased energy, feelings of guilt/worthlessness, and hopelessness.  Patient denies crying spells or irritability.  Patient also  endorses worsening anxiety and attributes her anxiety to the way that she is reacting to the external factors in her life.  A PHQ-9 screening was performed with the patient scoring a 15.  A GAD-7 screen was also performed with the patient scoring a 14.  Patient is alert and oriented x 4, calm, cooperative, and fully engaged in conversation during the encounter.  Patient endorses okay mood.  Patient exhibits depressed mood with congruent affect.  Patient denies suicidal or homicidal ideations.  She further denies auditory or visual hallucinations and does not appear to be responding to internal/external stimuli.  Patient endorses good sleep (on average 8 hours of sleep per night.  Patient endorses good appetite and eats on average 3 meals per day.  She reports that at least one of her meals is a snack.  Patient denies alcohol consumption, tobacco use, or illicit drug use.  Visit Diagnosis:    ICD-10-CM   1. Moderate episode of recurrent major depressive disorder (HCC)  F33.1 ARIPiprazole  (ABILIFY ) 10 MG tablet    FLUoxetine  (PROZAC ) 40 MG capsule    2. Attention deficit hyperactivity disorder (ADHD), predominantly inattentive type  F90.0 amphetamine -dextroamphetamine  (ADDERALL) 15 MG tablet    3. Anxiety state  F41.1 FLUoxetine  (PROZAC ) 40 MG capsule      Past Psychiatric History:  Patient has a past psychiatric history significant for depression, anxiety, and ADD (unspecified hyperactivity present)   Patient denies a past history of hospitalization due to mental health   Patient denies a past history of suicide attempt Patient denies a past history of homicide attempt  Past Medical History:  Past Medical History:  Diagnosis Date   Anemia    H/O   Hx of  migraines    IBS (irritable bowel syndrome)     Past Surgical History:  Procedure Laterality Date   FOOT SURGERY Right    WISDOM TOOTH EXTRACTION      Family Psychiatric History:  Patient is unsure of family history of psychiatric  illness   Family history of suicide attempt: Patient denies Family history of homicide: Patient denies Family history of substance abuse: Patient endorses a family history of substance abuse but is unsure of the details  Family History:  Family History  Problem Relation Age of Onset   Diabetes Father    Hypertension Mother    Lupus Cousin     Social History:  Social History   Socioeconomic History   Marital status: Single    Spouse name: Not on file   Number of children: 0   Years of education: Not on file   Highest education level: Some college, no degree  Occupational History   Not on file  Tobacco Use   Smoking status: Never   Smokeless tobacco: Never  Vaping Use   Vaping status: Never Used  Substance and Sexual Activity   Alcohol use: No   Drug use: No   Sexual activity: Yes    Birth control/protection: Condom  Other Topics Concern   Not on file  Social History Narrative   Right handed, lives in an apartment on 2nd floor alone.   Pt drinks coffee, occasionally hot tea, soda sometimes, water   No children   Does not exercises regularly    Social Drivers of Corporate investment banker Strain: Not on file  Food Insecurity: Not on file  Transportation Needs: Not on file  Physical Activity: Not on file  Stress: Not on file  Social Connections: Not on file    Allergies:  Allergies  Allergen Reactions   Bupropion Hives   Metformin Nausea Only   Oxycodone-Acetaminophen  Itching   Tramadol  Itching   Codeine    Oxycodone    Tape     Adhesive allergy     Metabolic Disorder Labs: Lab Results  Component Value Date   HGBA1C 5.1 09/09/2023   No results found for: PROLACTIN Lab Results  Component Value Date   CHOL 173 09/09/2023   TRIG 133 09/09/2023   HDL 52 09/09/2023   CHOLHDL 3.3 09/09/2023   LDLCALC 98 09/09/2023   Lab Results  Component Value Date   TSH 5.72 (H) 04/03/2023   TSH 3.45 08/03/2022    Therapeutic Level Labs: No results  found for: LITHIUM No results found for: VALPROATE No results found for: CBMZ  Current Medications: Current Outpatient Medications  Medication Sig Dispense Refill   [START ON 04/09/2024] amphetamine -dextroamphetamine  (ADDERALL) 15 MG tablet Take 1 tablet by mouth 2 (two) times daily with a meal. 60 tablet 0   ARIPiprazole  (ABILIFY ) 10 MG tablet Take 1 tablet (10 mg total) by mouth daily. 30 tablet 1   FLUoxetine  (PROZAC ) 40 MG capsule Take 1 capsule (40 mg total) by mouth daily. 30 capsule 1   fluticasone (FLONASE) 50 MCG/ACT nasal spray Place 2 sprays into the nose daily.     levothyroxine  (SYNTHROID ) 25 MCG tablet Take 1 tablet (25 mcg total) by mouth daily. 30 tablet 3   loratadine (CLARITIN) 10 MG tablet Take 10 mg by mouth daily.     naproxen sodium (ALEVE) 220 MG tablet Take 220 mg by mouth as needed.     nystatin -triamcinolone  (MYCOLOG II) cream Apply 1 Application topically 2 (two) times daily.  30 g 0   Probiotic Product (PROBIOTIC DAILY PO) Take by mouth.     Ubrogepant  (UBRELVY ) 100 MG TABS Take 100 mg by mouth as needed (May repeat dose in 2 hours.  Maximum 2 tablets in 24 hours.). 10 tablet 5   No current facility-administered medications for this visit.     Musculoskeletal: Strength & Muscle Tone: within normal limits Gait & Station: normal Patient leans: N/A  Psychiatric Specialty Exam: Review of Systems  Psychiatric/Behavioral:  Positive for dysphoric mood and sleep disturbance. Negative for decreased concentration, hallucinations, self-injury and suicidal ideas. The patient is nervous/anxious. The patient is not hyperactive.     Blood pressure 135/80, pulse (!) 130, temperature 98.7 F (37.1 C), temperature source Oral, height 5' 5 (1.651 m), weight 172 lb (78 kg), SpO2 99%.Body mass index is 28.62 kg/m.  General Appearance: Casual  Eye Contact:  Good  Speech:  Clear and Coherent and Normal Rate  Volume:  Normal  Mood:  Anxious and Depressed  Affect:   Congruent  Thought Process:  Coherent, Goal Directed, and Descriptions of Associations: Intact  Orientation:  Full (Time, Place, and Person)  Thought Content: WDL   Suicidal Thoughts:  No  Homicidal Thoughts:  No  Memory:  Immediate;   Good Recent;   Good Remote;   Good  Judgement:  Good  Insight:  Fair  Psychomotor Activity:  Normal  Concentration:  Concentration: Good and Attention Span: Good  Recall:  Good  Fund of Knowledge: Good  Language: Good  Akathisia:  No  Handed:  Right  AIMS (if indicated): not done  Assets:  Communication Skills Desire for Improvement Financial Resources/Insurance Housing Physical Health Social Support Transportation  ADL's:  Intact  Cognition: WNL  Sleep:  Fair   Screenings: AIMS    Flowsheet Row Clinical Support from 04/02/2024 in [facility] Clinical Support from 02/24/2024 in [facility] Clinical Support from 01/15/2024 in [facility] Clinical Support from 12/04/2023 in [facility]  AIMS Total Score 2 2 2 1    GAD-7    Flowsheet Row Clinical Support from 04/02/2024 in [facility] Clinical Support from 02/24/2024 in [facility] Clinical Support from 01/15/2024 in [facility] Clinical Support from 12/04/2023 in [facility] Clinical Support from 10/23/2023 in [facility]  Total GAD-7 Score 14 18 12 14 17    PHQ2-9    Flowsheet Row Clinical Support from 04/02/2024 in [facility] Clinical Support from 02/24/2024 in [facility] Clinical Support from 01/15/2024 in [facility] Clinical Support from 12/04/2023 in [facility] Clinical Support from 10/23/2023 in [facility]  PHQ-2 Total Score 5 6 6 6 5   PHQ-9 Total Score 15 17 15 17 18    Flowsheet Row Clinical Support from 04/02/2024 in [facility] Clinical Support from 02/24/2024 in [facility] Clinical Support from 01/15/2024 in [facility]  C-SSRS RISK CATEGORY No Risk No Risk No Risk     Assessment and Plan:   Kelly Walsh is a 43 year old, African-American female with a past psychiatric history significant for attention deficit disorder (unspecified hyperactivity present), anxiety, and major depressive disorder who presents to [facility] for follow-up and medication management.  Patient presents to the  encounter stating that she continues to take her medications regularly and denies experiencing any adverse side effects.  An aims assessment was performed with the patient scoring of 2.  Patient reports that she experiences minimal involuntary movements in her facial expressions and lips.  No movements were observed during the assessment.  Due to her movements being minimal, patient denies the need for medication management at this time.  Patient reports that her use of her medications continue to go the same.  Patient denies changes in her depression or anxiety.  She continues to endorse depressive episodes most days of the week and states that her anxiety has been attributed to external factors in her life.  A PHQ-9 screen was performed with the patient scoring a 15.  A GAD-7 screen was also performed with the patient scoring a 14.  Provider discussed with patient about managing her depression through the use of Spravato.  Patient appears interested in seeing if she qualifies for Spravato.  She does report that she has a history of elevated heart rate at baseline and would like to know if this factor would disqualify her from finding out provider.  Provider to determine if patient  qualifies for Spravato treatments by contacting a [facility] facility.  Patient vocalized understanding.  Despite experiencing ongoing depression and anxiety, patient will continue to take her medications as prescribed until she hears about whether or not she is eligible for Spravato treatments.  Patient denies any adverse side effects from the use of her Adderall and states that the medication continues to be somewhat helpful in managing her focus and concentration.  Patient would like to continue taking the medication as prescribed.  Patient's labs are up-to-date.  Provider to schedule patient for an EKG appointment in the near future.  Patient's most recent EKG was performed on 08/23/2022.  - QTc: 437 ms (08/23/2022)  A [place] Suicide Severity Rating Scale was performed with the patient being considered no risk.  Patient denies suicidal ideations and is able to contract for safety at this time.   Collaboration of Care: Collaboration of Care: Medication Management AEB provider managing patient's psychiatric medications and Psychiatrist AEB patient being followed by a mental health provider  Patient/Guardian was advised Release of Information must be obtained prior to any record release in order to collaborate their care with an outside provider. Patient/Guardian was advised if they have not already done so to contact the registration department to sign all necessary forms in order for us  to release information regarding their care.   Consent: Patient/Guardian gives verbal consent for treatment and assignment of benefits for services provided during this visit. Patient/Guardian expressed understanding and agreed to proceed.   1. Moderate episode of recurrent major depressive disorder (HCC)  - ARIPiprazole  (ABILIFY ) 10 MG tablet; Take 1 tablet (10 mg total) by mouth daily.  Dispense: 30 tablet; Refill: 1 - FLUoxetine  (PROZAC ) 40 MG capsule; Take 1 capsule (40 mg total) by mouth daily.   Dispense: 30 capsule; Refill: 1  2. Attention deficit hyperactivity disorder (ADHD), predominantly inattentive type  - amphetamine -dextroamphetamine  (ADDERALL) 15 MG tablet; Take 1 tablet by mouth 2 (two) times daily with a meal.  Dispense: 60 tablet; Refill: 0  3. Anxiety state  - FLUoxetine  (PROZAC ) 40 MG capsule; Take 1 capsule (40 mg total) by mouth daily.  Dispense: 30 capsule; Refill: 1  Patient to follow up in 7 weeks Provider spent a total of 25 minutes with the patient/reviewing the patient's chart  Trenice Mesa E  Zoii Florer, PA 04/02/2024, 4:00 PM

## 2024-04-30 ENCOUNTER — Telehealth (HOSPITAL_COMMUNITY): Payer: Self-pay | Admitting: *Deleted

## 2024-04-30 NOTE — Telephone Encounter (Signed)
 VM left on nursing line from patient asking for an unpdate on her care plan unsure of what she means but was seen by her provider on 04/02/24. I will forward this concern to [place] PA to follow up.

## 2024-05-20 ENCOUNTER — Encounter (HOSPITAL_COMMUNITY): Payer: Self-pay | Admitting: Physician Assistant

## 2024-05-20 ENCOUNTER — Ambulatory Visit (INDEPENDENT_AMBULATORY_CARE_PROVIDER_SITE_OTHER): Admitting: Physician Assistant

## 2024-05-20 DIAGNOSIS — F411 Generalized anxiety disorder: Secondary | ICD-10-CM | POA: Diagnosis not present

## 2024-05-20 DIAGNOSIS — F331 Major depressive disorder, recurrent, moderate: Secondary | ICD-10-CM | POA: Diagnosis not present

## 2024-05-20 DIAGNOSIS — F900 Attention-deficit hyperactivity disorder, predominantly inattentive type: Secondary | ICD-10-CM | POA: Diagnosis not present

## 2024-05-20 MED ORDER — ARIPIPRAZOLE 10 MG PO TABS: 10.0000 mg | ORAL_TABLET | Freq: Every day | ORAL | 1 refills | Status: DC

## 2024-05-20 MED ORDER — AMPHETAMINE-DEXTROAMPHETAMINE 15 MG PO TABS: 15.0000 mg | ORAL_TABLET | Freq: Two times a day (BID) | ORAL | 0 refills | Status: DC

## 2024-05-20 MED ORDER — FLUOXETINE HCL 40 MG PO CAPS: 40.0000 mg | ORAL_CAPSULE | Freq: Every day | ORAL | 1 refills | Status: DC

## 2024-05-20 NOTE — Progress Notes (Signed)
 [facility] MD/PA/NP OP Progress Note  05/20/2024 4:00 PM Kelly Walsh  MRN:  [id number]  Chief Complaint:  Chief Complaint  Patient presents with   Follow-up   Medication Refill   HPI:   Kelly Walsh. Kelly Walsh is a 43 year old, African-American female with a past psychiatric history significant for attention deficit disorder (unspecified hyperactivity present), anxiety, and major depressive disorder who presents to [facility] for follow-up and medication management.  Patient is currently being managed on the following psychiatric medications:   Prozac  40 mg daily Abilify  10 mg daily Amphetamine -dextroamphetamine  (Adderall) 15 mg daily 2 times daily  Patient presents to the encounter stating that she feels that she is getting worse, despite taking her medications regularly.  Patient endorses lack of motivation stating that she struggles to get anything done.  Patient denies feeling any better and rates her depression a 9 out of 10 with 10 being most severe.  She reports that this month has been absolutely terrible for her and is unsure of the reason why.  Though patient reports that she is eating and getting enough sleep, she reports that she feels terrible mentally and physically.  Patient endorses depressive episodes every day.  Patient endorses the following depressive symptoms: feelings of sadness, lack of motivation, decreased concentration (worsening memory), decreased energy, irritability, feelings of guilt/worthlessness, and hopelessness.  In addition to her depression, patient reports that her anxiety has not been any better.  She reports that there is nothing actively happening in her life to contribute to her anxiety.  Though she endorses anxiety, she reports that her depression is way more elevated.  As previously mentioned, patient continues to take her Adderall regularly.  She denies experiencing any adverse side effects associated with her use of  Adderall.  She reports that the medication has not been getting her as productive as she should be.  She reports that doing anything is still a struggle for her and that she is getting less down than before.  A PHQ-9 screen was performed with the patient scoring a 21.  A GAD-7 screen was also performed with the patient scoring aa 13.  Patient is alert and oriented x 4, calm, cooperative, and fully engaged in conversation during the encounter.  Patient describes her mood as blah.  She reports that she feels very uninterested in things.  Patient exhibits depressed mood with congruent affect.  Patient denies suicidal or homicidal ideations.  She further denies auditory or visual hallucinations and does not appear to be responding to internal/external stimuli.  Patient endorses fair sleep and receives on average 8 hours of sleep, collectively.  Patient endorses good appetite and eats on average 2 meals per day along with snacking throughout the day.  Patient denies alcohol consumption, tobacco use, or illicit drug use.  Visit Diagnosis:    ICD-10-CM   1. Moderate episode of recurrent major depressive disorder (HCC)  F33.1 ARIPiprazole  (ABILIFY ) 10 MG tablet    FLUoxetine  (PROZAC ) 40 MG capsule    2. Attention deficit hyperactivity disorder (ADHD), predominantly inattentive type  F90.0 amphetamine -dextroamphetamine  (ADDERALL) 15 MG tablet    3. Anxiety state  F41.1 FLUoxetine  (PROZAC ) 40 MG capsule      Past Psychiatric History:  Patient has a past psychiatric history significant for depression, anxiety, and ADD (unspecified hyperactivity present)   Patient denies a past history of hospitalization due to mental health   Patient denies a past history of suicide attempt Patient denies a past history of homicide  attempt  Past Medical History:  Past Medical History:  Diagnosis Date   Anemia    H/O   Hx of migraines    IBS (irritable bowel syndrome)     Past Surgical History:  Procedure  Laterality Date   FOOT SURGERY Right    WISDOM TOOTH EXTRACTION      Family Psychiatric History:  Patient is unsure of family history of psychiatric illness   Family history of suicide attempt: Patient denies Family history of homicide: Patient denies Family history of substance abuse: Patient endorses a family history of substance abuse but is unsure of the details  Family History:  Family History  Problem Relation Age of Onset   Diabetes Father    Hypertension Mother    Lupus Cousin     Social History:  Social History   Socioeconomic History   Marital status: Single    Spouse name: Not on file   Number of children: 0   Years of education: Not on file   Highest education level: Some college, no degree  Occupational History   Not on file  Tobacco Use   Smoking status: Never   Smokeless tobacco: Never  Vaping Use   Vaping status: Never Used  Substance and Sexual Activity   Alcohol use: No   Drug use: No   Sexual activity: Yes    Birth control/protection: Condom  Other Topics Concern   Not on file  Social History Narrative   Right handed, lives in an apartment on 2nd floor alone.   Pt drinks coffee, occasionally hot tea, soda sometimes, water   No children   Does not exercises regularly    Social Drivers of Corporate Investment Banker Strain: Not on file  Food Insecurity: Not on file  Transportation Needs: Not on file  Physical Activity: Not on file  Stress: Not on file  Social Connections: Not on file    Allergies:  Allergies  Allergen Reactions   Bupropion Hives   Metformin Nausea Only   Oxycodone-Acetaminophen  Itching   Tramadol  Itching   Codeine    Oxycodone    Tape     Adhesive allergy     Metabolic Disorder Labs: Lab Results  Component Value Date   HGBA1C 5.1 09/09/2023   No results found for: PROLACTIN Lab Results  Component Value Date   CHOL 173 09/09/2023   TRIG 133 09/09/2023   HDL 52 09/09/2023   CHOLHDL 3.3 09/09/2023    LDLCALC 98 09/09/2023   Lab Results  Component Value Date   TSH 5.72 (H) 04/03/2023   TSH 3.45 08/03/2022    Therapeutic Level Labs: No results found for: LITHIUM No results found for: VALPROATE No results found for: CBMZ  Current Medications: Current Outpatient Medications  Medication Sig Dispense Refill   amphetamine -dextroamphetamine  (ADDERALL) 15 MG tablet Take 1 tablet by mouth 2 (two) times daily with a meal. 60 tablet 0   ARIPiprazole  (ABILIFY ) 10 MG tablet Take 1 tablet (10 mg total) by mouth daily. 30 tablet 1   FLUoxetine  (PROZAC ) 40 MG capsule Take 1 capsule (40 mg total) by mouth daily. 30 capsule 1   fluticasone (FLONASE) 50 MCG/ACT nasal spray Place 2 sprays into the nose daily.     levothyroxine  (SYNTHROID ) 25 MCG tablet Take 1 tablet (25 mcg total) by mouth daily. 30 tablet 3   loratadine (CLARITIN) 10 MG tablet Take 10 mg by mouth daily.     naproxen sodium (ALEVE) 220 MG tablet Take 220 mg  by mouth as needed.     nystatin -triamcinolone  (MYCOLOG II) cream Apply 1 Application topically 2 (two) times daily. 30 g 0   Probiotic Product (PROBIOTIC DAILY PO) Take by mouth.     Ubrogepant  (UBRELVY ) 100 MG TABS Take 100 mg by mouth as needed (May repeat dose in 2 hours.  Maximum 2 tablets in 24 hours.). 10 tablet 5   No current facility-administered medications for this visit.     Musculoskeletal: Strength & Muscle Tone: within normal limits Gait & Station: normal Patient leans: N/A  Psychiatric Specialty Exam: Review of Systems  Psychiatric/Behavioral:  Positive for dysphoric mood and sleep disturbance. Negative for decreased concentration, hallucinations, self-injury and suicidal ideas. The patient is nervous/anxious. The patient is not hyperactive.     Blood pressure 137/81, pulse (!) 135, temperature 98.1 F (36.7 C), temperature source Oral, height 5' 5 (1.651 m), weight 168 lb 12.8 oz (76.6 kg), SpO2 100%.Body mass index is 28.09 kg/m.  General  Appearance: Casual  Eye Contact:  Good  Speech:  Clear and Coherent and Normal Rate  Volume:  Normal  Mood:  Anxious and Depressed  Affect:  Congruent  Thought Process:  Coherent, Goal Directed, and Descriptions of Associations: Intact  Orientation:  Full (Time, Place, and Person)  Thought Content: WDL   Suicidal Thoughts:  No  Homicidal Thoughts:  No  Memory:  Immediate;   Good Recent;   Good Remote;   Good  Judgement:  Good  Insight:  Fair  Psychomotor Activity:  Normal  Concentration:  Concentration: Good and Attention Span: Good  Recall:  Good  Fund of Knowledge: Good  Language: Good  Akathisia:  No  Handed:  Right  AIMS (if indicated): not done  Assets:  Communication Skills Desire for Improvement Financial Resources/Insurance Housing Physical Health Social Support Transportation  ADL's:  Intact  Cognition: WNL  Sleep:  Fair   Screenings: AIMS    Flowsheet Row Clinical Support from 05/20/2024 in [facility] Clinical Support from 04/02/2024 in [facility] Clinical Support from 02/24/2024 in [facility] Clinical Support from 01/15/2024 in [facility] Clinical Support from 12/04/2023 in [facility]  AIMS Total Score 2 2 2 2 1    GAD-7    Flowsheet Row Clinical Support from 05/20/2024 in [facility] Clinical Support from 04/02/2024 in [facility] Clinical Support from 02/24/2024 in [facility] Clinical Support from 01/15/2024 in [facility] Clinical Support from 12/04/2023 in [facility]  Total GAD-7 Score 13 14 18 12 14    PHQ2-9    Flowsheet Row Clinical Support from 05/20/2024 in [facility] Clinical Support from 04/02/2024 in [facility] Clinical Support from 02/24/2024 in [facility] Clinical Support from 01/15/2024 in [facility] Clinical Support from 12/04/2023 in [facility]  PHQ-2 Total Score 6 5 6 6 6   PHQ-9 Total Score 21 15 17 15 17    Flowsheet Row Clinical Support from 05/20/2024 in [facility] Clinical Support from 04/02/2024 in [facility] Clinical Support from 02/24/2024 in [facility]  C-SSRS RISK CATEGORY No Risk No Risk No Risk     Assessment and Plan:   Anyssa Sharpless. Kelly Walsh is a 43 year old, African-American female with a past psychiatric  history significant for attention deficit disorder (unspecified hyperactivity present), anxiety, and major depressive disorder who presents to [facility] for follow-up and medication management.  Patient presents to the encounter stating that she continues to take her medication regularly and denies experiencing any adverse side effects.  Though she continues to take her Adderall regularly, patient reports that she does not feel as motivated as she usually is while on the medication.  She reports that she is getting less done than before and everything is a struggle to do.  Patient presents to the encounter experiencing worsening depression as well as anxiety.  She reports that though she gets enough sleep and eats regularly, she feels terrible both mentally and physically.  A PHQ-9 screen was performed with the patient scoring of 21.  A GAD-7 screen was also performed with the patient scoring of 13.  Despite her worsening depression and anxiety, patient denies wanting to adjust her dosages at this time.  Patient still continues to show interest in the use of Spravato for the management of her treatment resistant depression.  Provider provided patient with the contact  information for [facility] (this facility provides Spravato treatment) to determine if she qualifies to receive Spravato treatment.  Patient verbalized understanding.  Patient's medications to be e-prescribed to pharmacy of choice.  A [place] Suicide Severity Rating Scale was performed with the patient being considered no risk.  Patient denies suicidal ideations and is able to contract for safety at this time.   Collaboration of Care: Collaboration of Care: Medication Management AEB provider managing patient's psychiatric medications and Psychiatrist AEB patient being followed by a mental health provider  Patient/Guardian was advised Release of Information must be obtained prior to any record release in order to collaborate their care with an outside provider. Patient/Guardian was advised if they have not already done so to contact the registration department to sign all necessary forms in order for us  to release information regarding their care.   Consent: Patient/Guardian gives verbal consent for treatment and assignment of benefits for services provided during this visit. Patient/Guardian expressed understanding and agreed to proceed.   1. Moderate episode of recurrent major depressive disorder (HCC)  - ARIPiprazole  (ABILIFY ) 10 MG tablet; Take 1 tablet (10 mg total) by mouth daily.  Dispense: 30 tablet; Refill: 1 - FLUoxetine  (PROZAC ) 40 MG capsule; Take 1 capsule (40 mg total) by mouth daily.  Dispense: 30 capsule; Refill: 1  2. Attention deficit hyperactivity disorder (ADHD), predominantly inattentive type  - amphetamine -dextroamphetamine  (ADDERALL) 15 MG tablet; Take 1 tablet by mouth 2 (two) times daily with a meal.  Dispense: 60 tablet; Refill: 0  3. Anxiety state  - FLUoxetine  (PROZAC ) 40 MG capsule; Take 1 capsule (40 mg total) by mouth daily.  Dispense: 30 capsule; Refill: 1  Patient to follow up in 7 weeks Provider spent a total of 22 minutes with the  patient/reviewing the patient's chart  Reginia FORBES Bolster, PA 05/20/2024, 4:00 PM

## 2024-06-22 ENCOUNTER — Telehealth (HOSPITAL_COMMUNITY): Payer: Self-pay

## 2024-06-22 NOTE — Telephone Encounter (Signed)
 Patient's pharmacy faxed over refill request for Abilify  10 mg.

## 2024-06-30 ENCOUNTER — Other Ambulatory Visit (HOSPITAL_COMMUNITY): Payer: Self-pay | Admitting: Physician Assistant

## 2024-06-30 DIAGNOSIS — F331 Major depressive disorder, recurrent, moderate: Secondary | ICD-10-CM

## 2024-06-30 MED ORDER — ARIPIPRAZOLE 10 MG PO TABS: 10.0000 mg | ORAL_TABLET | Freq: Every day | ORAL | 1 refills | Status: DC

## 2024-06-30 NOTE — Progress Notes (Signed)
 Provider was contacted by Aundra A. Trudy, RN regarding a fax from patient's pharmacy requesting a medication refill. Patient's medication to be e-prescribed to pharmacy of choice.

## 2024-06-30 NOTE — Telephone Encounter (Signed)
 Message acknowledged and reviewed.

## 2024-08-05 ENCOUNTER — Encounter (HOSPITAL_COMMUNITY): Payer: Self-pay | Admitting: Physician Assistant

## 2024-08-05 ENCOUNTER — Telehealth (INDEPENDENT_AMBULATORY_CARE_PROVIDER_SITE_OTHER): Admitting: Physician Assistant

## 2024-08-05 DIAGNOSIS — F900 Attention-deficit hyperactivity disorder, predominantly inattentive type: Secondary | ICD-10-CM | POA: Diagnosis not present

## 2024-08-05 DIAGNOSIS — F411 Generalized anxiety disorder: Secondary | ICD-10-CM | POA: Diagnosis not present

## 2024-08-05 DIAGNOSIS — F331 Major depressive disorder, recurrent, moderate: Secondary | ICD-10-CM | POA: Diagnosis not present

## 2024-08-05 MED ORDER — AMPHETAMINE-DEXTROAMPHETAMINE 15 MG PO TABS: 15.0000 mg | ORAL_TABLET | Freq: Two times a day (BID) | ORAL | 0 refills | Status: AC

## 2024-08-05 MED ORDER — ARIPIPRAZOLE 2 MG PO TABS: ORAL_TABLET | ORAL | 0 refills | Status: AC

## 2024-08-05 MED ORDER — FLUOXETINE HCL 40 MG PO CAPS: 40.0000 mg | ORAL_CAPSULE | Freq: Every day | ORAL | 1 refills | Status: AC

## 2024-08-05 NOTE — Progress Notes (Unsigned)
 BH MD/PA/NP OP Progress Note  Virtual Visit via Video Note  I connected with Kelly Walsh on 08/05/24 at  4:00 PM EST by a video enabled telemedicine application and verified that I am speaking with the correct person using two identifiers.  Location: Patient: Home Provider: Clinic   I discussed the limitations of evaluation and management by telemedicine and the availability of in person appointments. The patient expressed understanding and agreed to proceed.  Follow Up Instructions:  I discussed the assessment and treatment plan with the patient. The patient was provided an opportunity to ask questions and all were answered. The patient agreed with the plan and demonstrated an understanding of the instructions.   The patient was advised to call back or seek an in-person evaluation if the symptoms worsen or if the condition fails to improve as anticipated.  I provided 23 minutes of non-face-to-face time during this encounter.  Kelly FORBES Bolster, PA    08/05/2024 4:00 PM Kelly Walsh  MRN:  [id number]  Chief Complaint:  Chief Complaint  Patient presents with   Follow-up   Medication Management   HPI:   Kelly Walthour. Walsh is a 44 year old, African-American female with a past psychiatric history significant for attention deficit disorder (unspecified hyperactivity present), anxiety, and major depressive disorder who presents to [facility] via virtual video visit for follow-up and medication management.  Patient is currently being managed on the following psychiatric medications:   Prozac  40 mg daily Abilify  10 mg daily Amphetamine -dextroamphetamine  (Adderall) 15 mg daily 2 times daily  Patient   Patient is alert and oriented x 4, calm, cooperative, and fully engaged in conversation during the encounter. ***  Visit Diagnosis:    ICD-10-CM   1. Moderate episode of recurrent major depressive disorder (HCC)  F33.1 ARIPiprazole   (ABILIFY ) 2 MG tablet    FLUoxetine  (PROZAC ) 40 MG capsule    2. Attention deficit hyperactivity disorder (ADHD), predominantly inattentive type  F90.0 amphetamine -dextroamphetamine  (ADDERALL) 15 MG tablet    3. Anxiety state  F41.1 FLUoxetine  (PROZAC ) 40 MG capsule      Past Psychiatric History:  Patient has a past psychiatric history significant for depression, anxiety, and ADD (unspecified hyperactivity present)   Patient denies a past history of hospitalization due to mental health   Patient denies a past history of suicide attempt Patient denies a past history of homicide attempt  Past Medical History:  Past Medical History:  Diagnosis Date   Anemia    H/O   Hx of migraines    IBS (irritable bowel syndrome)     Past Surgical History:  Procedure Laterality Date   FOOT SURGERY Right    WISDOM TOOTH EXTRACTION      Family Psychiatric History:  Patient is unsure of family history of psychiatric illness   Family history of suicide attempt: Patient denies Family history of homicide: Patient denies Family history of substance abuse: Patient endorses a family history of substance abuse but is unsure of the details  Family History:  Family History  Problem Relation Age of Onset   Diabetes Father    Hypertension Mother    Lupus Cousin     Social History:  Social History   Socioeconomic History   Marital status: Single    Spouse name: Not on file   Number of children: 0   Years of education: Not on file   Highest education level: Some college, no degree  Occupational History   Not on file  Tobacco Use   Smoking status: Never   Smokeless tobacco: Never  Vaping Use   Vaping status: Never Used  Substance and Sexual Activity   Alcohol use: No   Drug use: No   Sexual activity: Yes    Birth control/protection: Condom  Other Topics Concern   Not on file  Social History Narrative   Right handed, lives in an apartment on 2nd floor alone.    Pt drinks coffee, occasionally hot tea, soda sometimes, water   No children   Does not exercises regularly    Social Drivers of Health   Tobacco Use: Low Risk (08/05/2024)   Patient History    Smoking Tobacco Use: Never    Smokeless Tobacco Use: Never    Passive Exposure: Not on file  Financial Resource Strain: Not on file  Food Insecurity: Not on file  Transportation Needs: Not on file  Physical Activity: Not on file  Stress: Not on file  Social Connections: Not on file  Depression (PHQ2-9): High Risk (08/05/2024)   Depression (PHQ2-9)    PHQ-2 Score: 17  Alcohol Screen: Not on file  Housing: Not on file  Utilities: Not on file  Health Literacy: Not on file    Allergies:  Allergies  Allergen Reactions   Bupropion Hives   Metformin Nausea Only   Oxycodone-Acetaminophen  Itching   Tramadol  Itching   Codeine    Oxycodone    Tape     Adhesive allergy     Metabolic Disorder Labs: Lab Results  Component Value Date   HGBA1C 5.1 09/09/2023   No results found for: PROLACTIN Lab Results  Component Value Date   CHOL 173 09/09/2023   TRIG 133 09/09/2023   HDL 52 09/09/2023   CHOLHDL 3.3 09/09/2023   LDLCALC 98 09/09/2023   Lab Results  Component Value Date   TSH 5.72 (H) 04/03/2023   TSH 3.45 08/03/2022    Therapeutic Level Labs: No results found for: LITHIUM No results found for: VALPROATE No results found for: CBMZ  Current Medications: Current Outpatient Medications  Medication Sig Dispense Refill   amphetamine -dextroamphetamine  (ADDERALL) 15 MG tablet Take 1 tablet by mouth 2 (two) times daily with a meal. 60 tablet 0   ARIPiprazole  (ABILIFY ) 2 MG tablet Take 1 tablet (2 mg total) by mouth daily for 14 days, THEN 0.5 tablets (1 mg total) daily for 14 days. Then discontinue. 21 tablet 0   FLUoxetine  (PROZAC ) 40 MG capsule Take 1 capsule (40 mg total) by mouth daily. 30 capsule 1   fluticasone (FLONASE) 50 MCG/ACT nasal spray Place 2  sprays into the nose daily.     levothyroxine  (SYNTHROID ) 25 MCG tablet Take 1 tablet (25 mcg total) by mouth daily. 30 tablet 3   loratadine (CLARITIN) 10 MG tablet Take 10 mg by mouth daily.     naproxen sodium (ALEVE) 220 MG tablet Take 220 mg by mouth as needed.     nystatin -triamcinolone  (MYCOLOG II) cream Apply 1 Application topically 2 (two) times daily. 30 g 0   Probiotic Product (PROBIOTIC DAILY PO) Take by mouth.     Ubrogepant  (UBRELVY ) 100 MG TABS Take 100 mg by mouth as needed (May repeat dose in 2 hours.  Maximum 2 tablets in 24 hours.). 10 tablet 5   No current facility-administered medications for this visit.     Musculoskeletal: Strength & Muscle Tone: within normal limits Gait & Station: normal Patient leans: N/A  Psychiatric Specialty Exam: Review of Systems  Psychiatric/Behavioral:  Positive  for dysphoric mood and sleep disturbance. Negative for decreased concentration, hallucinations, self-injury and suicidal ideas. The patient is nervous/anxious. The patient is not hyperactive.     There were no vitals taken for this visit.There is no height or weight on file to calculate BMI.  General Appearance: Casual  Eye Contact:  Good  Speech:  Clear and Coherent and Normal Rate  Volume:  Normal  Mood:  Anxious and Depressed  Affect:  Congruent  Thought Process:  Coherent, Goal Directed, and Descriptions of Associations: Intact  Orientation:  Full (Time, Place, and Person)  Thought Content: WDL   Suicidal Thoughts:  No  Homicidal Thoughts:  No  Memory:  Immediate;   Good Recent;   Good Remote;   Good  Judgement:  Good  Insight:  Fair  Psychomotor Activity:  Normal  Concentration:  Concentration: Good and Attention Span: Good  Recall:  Good  Fund of Knowledge: Good  Language: Good  Akathisia:  No  Handed:  Right  AIMS (if indicated): not done  Assets:  Communication Skills Desire for Improvement Financial Resources/Insurance Housing Physical  Health Social Support Transportation  ADL's:  Intact  Cognition: WNL  Sleep:  Fair   Screenings: AIMS    Flowsheet Row Video Visit from 08/05/2024 in [facility] Clinical Support from 05/20/2024 in [facility] Clinical Support from 04/02/2024 in [facility] Clinical Support from 02/24/2024 in [facility] Clinical Support from 01/15/2024 in [facility]  AIMS Total Score 0 2 2 2 2    GAD-7    Flowsheet Row Video Visit from 08/05/2024 in [facility] Clinical Support from 05/20/2024 in [facility] Clinical Support from 04/02/2024 in [facility] Clinical Support from 02/24/2024 in [facility] Clinical Support from 01/15/2024 in [facility]  Total GAD-7 Score 10 13 14 18 12    PHQ2-9    Flowsheet Row Video Visit from 08/05/2024 in [facility] Clinical Support from 05/20/2024 in [facility] Clinical Support from 04/02/2024 in [facility] Clinical Support from 02/24/2024 in [facility] Clinical Support from 01/15/2024 in [facility]  PHQ-2 Total Score 5 6 5 6 6   PHQ-9 Total Score 17 21 15 17 15    Flowsheet Row Video Visit from 08/05/2024 in [facility] Clinical Support from 05/20/2024 in [facility] Clinical Support from 04/02/2024 in [facility]  C-SSRS RISK CATEGORY No Risk No Risk No Risk     Assessment and Plan:   Kelly Lapre. Walsh is a 44 year old, African-American female with a past psychiatric history significant for attention deficit disorder (unspecified hyperactivity present), anxiety, and  major depressive disorder who presents to [facility] via virtual video visit for follow-up and medication management. ***  ***  A [place] Suicide Severity Rating Scale was performed with the patient being considered no risk.  Patient denies suicidal ideations and is able to contract for safety at this time.   Collaboration of Care: Collaboration of Care: Medication Management AEB provider managing patient's psychiatric medications and Psychiatrist AEB patient being followed by a mental health provider  Patient/Guardian was advised Release of Information must be obtained prior to any record release in order to collaborate their care with an outside provider. Patient/Guardian  was advised if they have not already done so to contact the registration department to sign all necessary forms in order for us  to release information regarding their care.   Consent: Patient/Guardian gives verbal consent for treatment and assignment of benefits for services provided during this visit. Patient/Guardian expressed understanding and agreed to proceed.   1. Moderate episode of recurrent major depressive disorder (HCC)  - ARIPiprazole  (ABILIFY ) 2 MG tablet; Take 1 tablet (2 mg total) by mouth daily for 14 days, THEN 0.5 tablets (1 mg total) daily for 14 days. Then discontinue.  Dispense: 21 tablet; Refill: 0 - FLUoxetine  (PROZAC ) 40 MG capsule; Take 1 capsule (40 mg total) by mouth daily.  Dispense: 30 capsule; Refill: 1  2. Attention deficit hyperactivity disorder (ADHD), predominantly inattentive type  - amphetamine -dextroamphetamine  (ADDERALL) 15 MG tablet; Take 1 tablet by mouth 2 (two) times daily with a meal.  Dispense: 60 tablet; Refill: 0  3. Anxiety state  - FLUoxetine  (PROZAC ) 40 MG capsule; Take 1 capsule (40 mg total) by mouth daily.  Dispense: 30 capsule; Refill: 1  Patient to follow up in 6 weeks Provider spent a total of 23 minutes with the  patient/reviewing the patient's chart  Kelly FORBES Bolster, PA 08/05/2024, 4:00 PM

## 2024-09-16 ENCOUNTER — Encounter (HOSPITAL_COMMUNITY): Admitting: Physician Assistant
